# Patient Record
Sex: Female | Born: 1970 | Race: Black or African American | Hispanic: No | Smoking: Never smoker
Health system: Southern US, Community
[De-identification: ages and names within clinical notes are randomized; demographics above are authoritative.]

## PROBLEM LIST (undated history)

## (undated) DIAGNOSIS — M329 Systemic lupus erythematosus, unspecified: Secondary | ICD-10-CM

## (undated) DIAGNOSIS — D649 Anemia, unspecified: Secondary | ICD-10-CM

## (undated) DIAGNOSIS — K219 Gastro-esophageal reflux disease without esophagitis: Secondary | ICD-10-CM

## (undated) DIAGNOSIS — I10 Essential (primary) hypertension: Secondary | ICD-10-CM

## (undated) DIAGNOSIS — G43909 Migraine, unspecified, not intractable, without status migrainosus: Secondary | ICD-10-CM

## (undated) DIAGNOSIS — IMO0002 Reserved for concepts with insufficient information to code with codable children: Secondary | ICD-10-CM

## (undated) DIAGNOSIS — Z90710 Acquired absence of both cervix and uterus: Secondary | ICD-10-CM

## (undated) HISTORY — PX: ABDOMINAL HYSTERECTOMY: SHX81

## (undated) HISTORY — DX: Systemic lupus erythematosus, unspecified: M32.9

## (undated) HISTORY — PX: TUBAL LIGATION: SHX77

## (undated) HISTORY — DX: Reserved for concepts with insufficient information to code with codable children: IMO0002

## (undated) HISTORY — PX: WISDOM TOOTH EXTRACTION: SHX21

---

## 2001-05-07 ENCOUNTER — Encounter: Admission: RE | Admit: 2001-05-07 | Discharge: 2001-05-07 | Payer: Self-pay | Admitting: Family Medicine

## 2002-03-25 ENCOUNTER — Other Ambulatory Visit: Admission: RE | Admit: 2002-03-25 | Discharge: 2002-03-25 | Payer: Self-pay | Admitting: *Deleted

## 2002-09-20 ENCOUNTER — Other Ambulatory Visit: Admission: RE | Admit: 2002-09-20 | Discharge: 2002-09-20 | Payer: Self-pay | Admitting: *Deleted

## 2002-09-30 ENCOUNTER — Ambulatory Visit (HOSPITAL_COMMUNITY): Admission: RE | Admit: 2002-09-30 | Discharge: 2002-09-30 | Payer: Self-pay | Admitting: *Deleted

## 2002-12-08 ENCOUNTER — Encounter: Payer: Self-pay | Admitting: *Deleted

## 2002-12-08 ENCOUNTER — Ambulatory Visit (HOSPITAL_COMMUNITY): Admission: RE | Admit: 2002-12-08 | Discharge: 2002-12-08 | Payer: Self-pay | Admitting: *Deleted

## 2004-07-15 ENCOUNTER — Encounter: Admission: RE | Admit: 2004-07-15 | Discharge: 2004-07-15 | Payer: Self-pay | Admitting: *Deleted

## 2008-07-28 ENCOUNTER — Ambulatory Visit (HOSPITAL_COMMUNITY): Admission: RE | Admit: 2008-07-28 | Discharge: 2008-07-28 | Payer: Self-pay | Admitting: Unknown Physician Specialty

## 2010-04-02 ENCOUNTER — Encounter: Admission: RE | Admit: 2010-04-02 | Discharge: 2010-04-02 | Payer: Self-pay | Admitting: Chiropractic Medicine

## 2010-08-06 ENCOUNTER — Ambulatory Visit (HOSPITAL_COMMUNITY)
Admission: RE | Admit: 2010-08-06 | Discharge: 2010-08-06 | Payer: Self-pay | Source: Home / Self Care | Attending: Obstetrics and Gynecology | Admitting: Obstetrics and Gynecology

## 2010-12-27 NOTE — H&P (Signed)
   NAMEASTRIA, JORDAHL NO.:  [id number]   MEDICAL RECORD NO.:  [id number]                   PATIENT TYPE:  AMB   LOCATION:  [facility]                                  [facility]ACILITY:  [facility]   PHYSICIAN:  Heron B. Earlene Plater, M.D.               DATE OF BIRTH:  Dec 12, 1970   DATE OF ADMISSION:  09/30/2002  DATE OF DISCHARGE:                                HISTORY & PHYSICAL   PREOPERATIVE DIAGNOSIS:  Desires tubal sterilization.   INTENDED PROCEDURE:  Essure tubal sterilization.   HISTORY OF PRESENT ILLNESS:  The patient is a 40 year old African-American  female, gravida 2, para 1, A1, who presents for permanent sterilization.  Optionis have been discussed, and the patient would like try the Essure  procedure.   PAST MEDICAL HISTORY:  1. Lupus with associated hypertension.  2. Arthritis.   PAST SURGICAL HISTORY:  C-section x 1 with preeclampsia.   MEDICATIONS:  1. Plaquenil 60 mg daily.  2. Vioxx p.r.n.  3. Micronor.   ALLERGIES:  CODEINE.   SOCIAL HISTORY:  No tobacco, occasional alcohol, no other drugs.   FAMILY HISTORY:  Heart disease, diabetes, hypertension, and sarcoid.   REVIEW OF SYSTEMS:  Otherwise noncontributory.   PHYSICAL EXAMINATION:  VITAL SIGNS:  Blood pressure 140/98, temperature  98.6, weight 131-3/4.  GENERAL:  No acute distress.  SKIN: Malar rash noted consistent with lupus.  NECK:  Supple.  No thyromegaly.  HEART:  Regular rate and rhythm.  LUNGS:  Clear to auscultation.  ABDOMEN:  No hernia.  LYMPH NOTE SURGERY:  Negative neck, axillae, and groin.  BREASTS:  No dominant mass, discharge, or adenopathy.  PELVIC:  External genitalia, vagina, cervix normal.  Uterus normal size,  nontender. Adnexa without mass or tenderness.   ASSESSMENT:  Desires permanent sterilization.  Non-permanent alternatives  discussed, and other permanent methods such as laparoscopic tubal were  discussed.  The patient informed of 10% inability to cannulate  or visualize  both tubal ostia, also informed regarding three-month backup method  necessary and the three-month postop HSG as mandated by the FDA.  Operative  risks discussed including infection, bleeding, uterine perforation, damage  to bowel or bladder, orifice.  All questions answered.  The patient wishes  to proceed.                                               Gerri Spore B. Earlene Plater, M.D.    WBD/MEDQ  D:  09/23/2002  T:  09/23/2002  Job:  [id number]

## 2010-12-27 NOTE — Op Note (Signed)
   NAME:  ZOII, FLORER                         ACCOUNT NO.:  [id number]   MEDICAL RECORD NO.:  [id number]                   PATIENT TYPE:  AMB   LOCATION:  [facility]C                                  [facility]ILITY:  [facility]   PHYSICIAN:  [place] B. Earlene Plater, M.D.               DATE OF BIRTH:  Jul 16, 1971   DATE OF PROCEDURE:  09/30/2002  DATE OF DISCHARGE:                                 OPERATIVE REPORT   PREOPERATIVE DIAGNOSES:  Desires tubal sterilization.   POSTOPERATIVE DIAGNOSES:  Desires tubal sterilization.   PROCEDURE:  Essure tubal sterilization.   SURGEON:  Chester Holstein. Earlene Plater, M.D.   ANESTHESIA:  General (with LMA.   FINDINGS:  Atrophic endometrium.  Normal tubal ostia bilaterally.  Coils  noted after procedure, five on the right, two on the left.   FLUID DEFICIT:  25 mL of saline.   INDICATIONS:  Desires permanent sterilization with the Essure procedure.  Has been advised of need for three months of continued back-up contraception  and a three month postoperative hysterosalpingogram to confirm tubal  occlusion.   PROCEDURE:  The patient was taken to the operating room and LMA general  anesthesia obtained.  She was placed in the ski position and prepped and  draped in a standard fashion.  Bladder was emptied with a red rubber  catheter.  Examination under anesthesia showed a normal sized anteverted  uterus.  No adnexal masses.   Speculum was inserted.  Single tooth tenaculum placed on the anterior lip of  the cervix.  A 5 mm 30 degree hysteroscope inserted after being flushed with  saline.  With good uterine distention, both tubal ostia were visualized.  The right tube was first approached and the Essure device inserted to the  depth marker and released in a standard fashion.  Five coils were noted on  the right side after placement.  Procedure repeated on the opposite side in  the same fashion and two coils noted on the left.   The single tooth was removed and cervix hemostatic.   The patient tolerated  procedure well.  There were no complications.  She was taken to recovery  room awake, alert, in stable condition.                                               Gerri Spore B. Earlene Plater, M.D.    WBD/MEDQ  D:  09/30/2002  T:  09/30/2002  Job:  [id number]

## 2011-01-02 ENCOUNTER — Emergency Department (HOSPITAL_COMMUNITY)
Admission: EM | Admit: 2011-01-02 | Discharge: 2011-01-02 | Disposition: A | Discharge status: Home / Self Care | Payer: 59 | Attending: Emergency Medicine | Admitting: Emergency Medicine

## 2011-01-02 ENCOUNTER — Emergency Department (HOSPITAL_COMMUNITY): Payer: 59

## 2011-01-02 DIAGNOSIS — Z79899 Other long term (current) drug therapy: Secondary | ICD-10-CM | POA: Insufficient documentation

## 2011-01-02 DIAGNOSIS — I10 Essential (primary) hypertension: Secondary | ICD-10-CM | POA: Insufficient documentation

## 2011-01-02 DIAGNOSIS — M329 Systemic lupus erythematosus, unspecified: Secondary | ICD-10-CM | POA: Insufficient documentation

## 2011-01-02 DIAGNOSIS — K219 Gastro-esophageal reflux disease without esophagitis: Secondary | ICD-10-CM | POA: Insufficient documentation

## 2011-01-02 DIAGNOSIS — Z7982 Long term (current) use of aspirin: Secondary | ICD-10-CM | POA: Insufficient documentation

## 2011-01-02 DIAGNOSIS — R079 Chest pain, unspecified: Secondary | ICD-10-CM | POA: Insufficient documentation

## 2011-01-02 LAB — BASIC METABOLIC PANEL
BUN: 8 mg/dL (ref 6–23)
CO2: 27 mEq/L (ref 19–32)
Calcium: 9.4 mg/dL (ref 8.4–10.5)
Chloride: 103 mEq/L (ref 96–112)
Creatinine, Ser: 0.69 mg/dL (ref 0.4–1.2)
GFR calc Af Amer: >60 mL/min (ref >60)
GFR calc non Af Amer: >60 mL/min (ref >60)
Potassium: 3.9 mEq/L (ref 3.5–5.1)
Sodium: 138 mEq/L (ref 135–145)

## 2011-01-02 LAB — CK TOTAL AND CKMB (NOT AT ARMC)
CK, MB: 1.6 ng/mL (ref 0.3–4.0)
Relative Index: 1.1 (ref 0.0–2.5)
Total CK: 143 U/L (ref 7–177)

## 2011-01-02 LAB — URINE MICROSCOPIC-ADD ON

## 2011-01-02 LAB — URINALYSIS, ROUTINE W REFLEX MICROSCOPIC
Bilirubin Urine: NEGATIVE
Glucose, UA: NEGATIVE mg/dL
Hgb urine dipstick: NEGATIVE
Ketones, ur: NEGATIVE mg/dL
Nitrite: NEGATIVE
Protein, ur: NEGATIVE mg/dL
Specific Gravity, Urine: 1.007 (ref 1.005–1.030)
Urobilinogen, UA: 0.2 mg/dL (ref 0.0–1.0)
pH: 6.5 (ref 5.0–8.0)

## 2011-01-02 LAB — DIFFERENTIAL
Basophils Absolute: 0 10*3/uL (ref 0.0–0.1)
Basophils Relative: 1 % (ref 0–1)
Eosinophils Absolute: 0.1 10*3/uL (ref 0.0–0.7)
Eosinophils Relative: 1 % (ref 0–5)
Lymphocytes Relative: 23 % (ref 12–46)
Lymphs Abs: 1.6 10*3/uL (ref 0.7–4.0)
Monocytes Absolute: 0.5 10*3/uL (ref 0.1–1.0)
Monocytes Relative: 7 % (ref 3–12)
Neutro Abs: 4.8 10*3/uL (ref 1.7–7.7)
Neutrophils Relative %: 68 % (ref 43–77)

## 2011-01-02 LAB — POCT PREGNANCY, URINE: Preg Test, Ur: NEGATIVE

## 2011-01-02 LAB — CBC
HCT: 36.3 % (ref 36.0–46.0)
Hemoglobin: 11.7 g/dL — ABNORMAL LOW (ref 12.0–15.0)
MCH: 26.4 pg (ref 26.0–34.0)
MCHC: 32.2 g/dL (ref 30.0–36.0)
MCV: 81.8 fL (ref 78.0–100.0)
Platelets: 229 10*3/uL (ref 150–400)
RBC: 4.44 MIL/uL (ref 3.87–5.11)
RDW: 16.7 % — ABNORMAL HIGH (ref 11.5–15.5)
WBC: 7 10*3/uL (ref 4.0–10.5)

## 2011-01-02 LAB — PROTIME-INR
INR: 0.93 (ref 0.00–1.49)
Prothrombin Time: 12.7 seconds (ref 11.6–15.2)

## 2011-01-02 LAB — TROPONIN I: Troponin I: <0.3 ng/mL (ref <0.30)

## 2011-01-02 LAB — BASIC METABOLIC PANEL WITH GFR: Glucose, Bld: 86 mg/dL (ref 70–99)

## 2011-06-18 ENCOUNTER — Other Ambulatory Visit: Payer: Self-pay | Admitting: Internal Medicine

## 2011-06-18 DIAGNOSIS — Z139 Encounter for screening, unspecified: Secondary | ICD-10-CM

## 2011-06-25 ENCOUNTER — Ambulatory Visit
Admission: RE | Admit: 2011-06-25 | Discharge: 2011-06-25 | Disposition: A | Discharge status: Home / Self Care | Payer: 59 | Source: Ambulatory Visit | Attending: Internal Medicine | Admitting: Internal Medicine

## 2011-06-25 DIAGNOSIS — Z139 Encounter for screening, unspecified: Secondary | ICD-10-CM

## 2011-07-09 ENCOUNTER — Encounter (HOSPITAL_COMMUNITY): Payer: Self-pay | Admitting: *Deleted

## 2011-07-28 ENCOUNTER — Encounter (HOSPITAL_COMMUNITY): Payer: Self-pay

## 2011-07-31 ENCOUNTER — Other Ambulatory Visit: Payer: Self-pay | Admitting: Obstetrics and Gynecology

## 2011-08-12 HISTORY — PX: ENDOMETRIAL ABLATION: SHX621

## 2011-08-29 ENCOUNTER — Other Ambulatory Visit: Payer: Self-pay | Admitting: Obstetrics and Gynecology

## 2011-09-07 NOTE — H&P (Signed)
NAMESERAYA, JOBST NO.:  [id number]  MEDICAL RECORD NO.:  [id number]  LOCATION:  [facility]                         [facility]ILITY:  [facility]  PHYSICIAN:  Lenoard Aden, M.D.DATE OF BIRTH:  01-11-1971  DATE OF ADMISSION:  06/27/2011 DATE OF DISCHARGE:                             HISTORY & PHYSICAL   CHIEF COMPLAINT:  Refractory menorrhagia.  HISTORY OF PRESENT ILLNESS:  She is a 41 year old, African American female, G2, P1, who presents for refractory menorrhagia and primary intervention to include NovaSure endometrial ablation.  She has allergies to CODEINE and SULFA.  Medications include iron.  She has a medical history remarkable for lupus, hypertension, arthritis, and iron-deficiency anemia.  She has family history of heart disease, hypertension, breast cancer, and diabetes.  She is a nonsmoker, nondrinker.  Denying domestic or physical violence.  OBSTETRIC HISTORY:  Remarkable for 1 spontaneous abortion, 1 C-section with history of Essure tubal ligation in 2004, history of abnormal Pap smear.  PHYSICAL EXAMINATION:  GENERAL:  She is a well-developed, well- nourished, African American female.  Weight of 169 pounds.  Height of 65 inches. HEENT:  Normal. NECK:  Supple.  Full range of motion. LUNGS:  Clear. HEART:  Regular rhythm. ABDOMEN:  Soft, nontender. PELVIC:  A bulky, anteflexed uterus and no adnexal masses. EXTREMITIES:  No cords. NEUROLOGIC:  Nonfocal. SKIN:  Intact.  IMPRESSION:  Symptomatic menorrhagia with a history of Essure tubal ligation, history of uterine fibroids with no submucosal component.  PLAN:  Proceed with diagnostic hysteroscopy, D and C, NovaSure endometrial ablation.  Risks of anesthesia, infection, bleeding, injury to abdominal organs, need for repair were discussed.  Delayed versus immediate complications to include bowel and bladder injury were noted. The patient acknowledges and wishes to proceed.     Lenoard Aden, M.D.     RJT/MEDQ  D:  09/07/2011  T:  09/07/2011  Job:  [id number]

## 2011-09-08 ENCOUNTER — Ambulatory Visit (HOSPITAL_COMMUNITY)
Admission: RE | Admit: 2011-09-08 | Discharge: 2011-09-08 | Disposition: A | Discharge status: Home / Self Care | Payer: 59 | Source: Ambulatory Visit | Attending: Obstetrics and Gynecology | Admitting: Obstetrics and Gynecology

## 2011-09-08 ENCOUNTER — Encounter (HOSPITAL_COMMUNITY): Payer: Self-pay | Admitting: Anesthesiology

## 2011-09-08 ENCOUNTER — Ambulatory Visit (HOSPITAL_COMMUNITY): Payer: 59 | Admitting: Anesthesiology

## 2011-09-08 ENCOUNTER — Other Ambulatory Visit: Payer: Self-pay | Admitting: Obstetrics and Gynecology

## 2011-09-08 ENCOUNTER — Encounter (HOSPITAL_COMMUNITY)
Admission: RE | Disposition: A | Discharge status: Home / Self Care | Payer: Self-pay | Source: Ambulatory Visit | Attending: Obstetrics and Gynecology

## 2011-09-08 ENCOUNTER — Encounter (HOSPITAL_COMMUNITY): Payer: Self-pay | Admitting: *Deleted

## 2011-09-08 DIAGNOSIS — N840 Polyp of corpus uteri: Secondary | ICD-10-CM | POA: Insufficient documentation

## 2011-09-08 DIAGNOSIS — D259 Leiomyoma of uterus, unspecified: Secondary | ICD-10-CM | POA: Insufficient documentation

## 2011-09-08 DIAGNOSIS — I10 Essential (primary) hypertension: Secondary | ICD-10-CM | POA: Insufficient documentation

## 2011-09-08 DIAGNOSIS — M329 Systemic lupus erythematosus, unspecified: Secondary | ICD-10-CM | POA: Insufficient documentation

## 2011-09-08 DIAGNOSIS — N920 Excessive and frequent menstruation with regular cycle: Secondary | ICD-10-CM

## 2011-09-08 HISTORY — DX: Gastro-esophageal reflux disease without esophagitis: K21.9

## 2011-09-08 HISTORY — DX: Anemia, unspecified: D64.9

## 2011-09-08 LAB — CBC
Platelets: 189 10*3/uL (ref 150–400)
RDW: 14 % (ref 11.5–15.5)
WBC: 8.4 10*3/uL (ref 4.0–10.5)

## 2011-09-08 SURGERY — DILATATION & CURETTAGE/HYSTEROSCOPY WITH NOVASURE ABLATION
Anesthesia: General

## 2011-09-08 MED ORDER — PROMETHAZINE HCL 25 MG/ML IJ SOLN: 6.2500 mg | INTRAMUSCULAR | Status: DC | PRN

## 2011-09-08 MED ORDER — LACTATED RINGERS IV SOLN
INTRAVENOUS | Status: DC
Start: 2011-09-08 — End: 2011-09-08
  Administered 2011-09-08: 12:00:00 via INTRAVENOUS

## 2011-09-08 MED ORDER — LIDOCAINE HCL (CARDIAC) 20 MG/ML IV SOLN
INTRAVENOUS | Status: AC
  Filled 2011-09-08: qty 5

## 2011-09-08 MED ORDER — BUPIVACAINE HCL (PF) 0.25 % IJ SOLN
INTRAMUSCULAR | Status: DC | PRN
  Administered 2011-09-08: 20 mL

## 2011-09-08 MED ORDER — ESMOLOL HCL 10 MG/ML IV SOLN
INTRAVENOUS | Status: DC | PRN
  Administered 2011-09-08 (×2): 25 mg via INTRAVENOUS

## 2011-09-08 MED ORDER — FENTANYL CITRATE 0.05 MG/ML IJ SOLN
INTRAMUSCULAR | Status: AC
  Filled 2011-09-08: qty 2

## 2011-09-08 MED ORDER — KETOROLAC TROMETHAMINE 30 MG/ML IJ SOLN
INTRAMUSCULAR | Status: AC
  Filled 2011-09-08: qty 1

## 2011-09-08 MED ORDER — KETOROLAC TROMETHAMINE 30 MG/ML IJ SOLN: 15.0000 mg | Freq: Once | INTRAMUSCULAR | Status: DC | PRN

## 2011-09-08 MED ORDER — ONDANSETRON HCL 4 MG/2ML IJ SOLN
INTRAMUSCULAR | Status: AC
  Filled 2011-09-08: qty 2

## 2011-09-08 MED ORDER — ACETAMINOPHEN 325 MG PO TABS: 325.0000 mg | ORAL_TABLET | ORAL | Status: DC | PRN

## 2011-09-08 MED ORDER — LACTATED RINGERS IR SOLN
Status: DC | PRN
  Administered 2011-09-08: 1

## 2011-09-08 MED ORDER — PROPOFOL 10 MG/ML IV EMUL
INTRAVENOUS | Status: DC | PRN
  Administered 2011-09-08: 200 mg via INTRAVENOUS

## 2011-09-08 MED ORDER — KETOROLAC TROMETHAMINE 30 MG/ML IJ SOLN
INTRAMUSCULAR | Status: DC | PRN
  Administered 2011-09-08: 30 mg via INTRAMUSCULAR
  Administered 2011-09-08: 30 mg via INTRAVENOUS

## 2011-09-08 MED ORDER — DEXAMETHASONE SODIUM PHOSPHATE 10 MG/ML IJ SOLN
INTRAMUSCULAR | Status: AC
  Filled 2011-09-08: qty 1

## 2011-09-08 MED ORDER — LIDOCAINE HCL (CARDIAC) 20 MG/ML IV SOLN
INTRAVENOUS | Status: DC | PRN
  Administered 2011-09-08: 100 mg via INTRAVENOUS

## 2011-09-08 MED ORDER — MIDAZOLAM HCL 2 MG/2ML IJ SOLN
INTRAMUSCULAR | Status: AC
  Filled 2011-09-08: qty 2

## 2011-09-08 MED ORDER — PANTOPRAZOLE SODIUM 40 MG PO TBEC
DELAYED_RELEASE_TABLET | ORAL | Status: AC
  Filled 2011-09-08: qty 1

## 2011-09-08 MED ORDER — BUPIVACAINE HCL (PF) 0.25 % IJ SOLN
INTRAMUSCULAR | Status: AC
  Filled 2011-09-08: qty 30

## 2011-09-08 MED ORDER — OXYCODONE-ACETAMINOPHEN 5-325 MG PO TABS: 1.0000 | ORAL_TABLET | Freq: Four times a day (QID) | ORAL | Status: AC | PRN

## 2011-09-08 MED ORDER — FENTANYL CITRATE 0.05 MG/ML IJ SOLN: 25.0000 ug | INTRAMUSCULAR | Status: DC | PRN

## 2011-09-08 MED ORDER — PANTOPRAZOLE SODIUM 40 MG PO TBEC
40.0000 mg | DELAYED_RELEASE_TABLET | Freq: Once | ORAL | Status: AC
  Administered 2011-09-08: 40 mg via ORAL

## 2011-09-08 MED ORDER — PROPOFOL 10 MG/ML IV EMUL
INTRAVENOUS | Status: AC
  Filled 2011-09-08: qty 20

## 2011-09-08 MED ORDER — FENTANYL CITRATE 0.05 MG/ML IJ SOLN
INTRAMUSCULAR | Status: DC | PRN
  Administered 2011-09-08: 50 ug via INTRAVENOUS
  Administered 2011-09-08 (×4): 25 ug via INTRAVENOUS
  Administered 2011-09-08: 50 ug via INTRAVENOUS

## 2011-09-08 MED ORDER — DEXAMETHASONE SODIUM PHOSPHATE 4 MG/ML IJ SOLN
INTRAMUSCULAR | Status: DC | PRN
  Administered 2011-09-08: 10 mg via INTRAVENOUS

## 2011-09-08 MED ORDER — ONDANSETRON HCL 4 MG/2ML IJ SOLN
INTRAMUSCULAR | Status: DC | PRN
  Administered 2011-09-08: 4 mg via INTRAVENOUS

## 2011-09-08 MED ORDER — VASOPRESSIN 20 UNIT/ML IJ SOLN
INTRAMUSCULAR | Status: AC
  Filled 2011-09-08: qty 1

## 2011-09-08 MED ORDER — CEFAZOLIN SODIUM 1-5 GM-% IV SOLN
1.0000 g | INTRAVENOUS | Status: AC
Start: 2011-09-08 — End: 2011-09-08
  Administered 2011-09-08: 1 g via INTRAVENOUS

## 2011-09-08 MED ORDER — MIDAZOLAM HCL 5 MG/5ML IJ SOLN
INTRAMUSCULAR | Status: DC | PRN
  Administered 2011-09-08 (×2): 1 mg via INTRAVENOUS

## 2011-09-08 MED ORDER — ESMOLOL HCL 10 MG/ML IV SOLN
INTRAVENOUS | Status: AC
  Filled 2011-09-08: qty 10

## 2011-09-08 SURGICAL SUPPLY — 12 items
ABLATOR ENDOMETRIAL BIPOLAR (ABLATOR) ×2 IMPLANT
CATH ROBINSON RED A/P 16FR (CATHETERS) ×2 IMPLANT
CLOTH BEACON ORANGE TIMEOUT ST (SAFETY) ×2 IMPLANT
CONTAINER PREFILL 10% NBF 60ML (FORM) ×4 IMPLANT
GLOVE BIO SURGEON STRL SZ7.5 (GLOVE) ×4 IMPLANT
GOWN PREVENTION PLUS LG XLONG (DISPOSABLE) ×2 IMPLANT
GOWN STRL REIN XL XLG (GOWN DISPOSABLE) ×2 IMPLANT
PACK HYSTEROSCOPY LF (CUSTOM PROCEDURE TRAY) ×2 IMPLANT
PAD PREP 24X48 CUFFED NSTRL (MISCELLANEOUS) ×2 IMPLANT
SYR TB 1ML 25GX5/8 (SYRINGE) ×2 IMPLANT
TOWEL OR 17X24 6PK STRL BLUE (TOWEL DISPOSABLE) ×4 IMPLANT
WATER STERILE IRR 1000ML POUR (IV SOLUTION) ×2 IMPLANT

## 2011-09-08 NOTE — Transfer of Care (Signed)
Immediate Anesthesia Transfer of Care Note  Patient: Alicia Patel  Procedure(s) Performed:  DILATATION & CURETTAGE/HYSTEROSCOPY WITH NOVASURE ABLATION  Patient Location: PACU  Anesthesia Type: General  Level of Consciousness: awake, alert  and oriented  Airway & Oxygen Therapy: Patient Spontanous Breathing and Patient connected to nasal cannula oxygen  Post-op Assessment: Report given to PACU RN, Post -op Vital signs reviewed and stable and Patient moving all extremities X 4  Post vital signs: Reviewed and stable  Complications: No apparent anesthesia complications

## 2011-09-08 NOTE — Progress Notes (Signed)
Patient ID: Alicia Patel, female   DOB: 1970-10-14, 41 y.o.   MRN: [id number] Patient seen and examined. Consent witnessed and signed. No changes noted. Update completed.

## 2011-09-08 NOTE — Anesthesia Postprocedure Evaluation (Signed)
Anesthesia Post Note  Patient: Alicia Patel  Procedure(s) Performed:  DILATATION & CURETTAGE/HYSTEROSCOPY WITH NOVASURE ABLATION  Anesthesia type: GA  Patient location: PACU  Post pain: Pain level controlled  Post assessment: Post-op Vital signs reviewed  Last Vitals:  Filed Vitals:   09/08/11 1445  BP: 161/88  Pulse: 65  Temp:   Resp: 16    Post vital signs: Reviewed  Level of consciousness: sedated  Complications: No apparent anesthesia complications

## 2011-09-08 NOTE — Op Note (Signed)
Alicia Patel, Alicia Patel NO.:  [id number]  MEDICAL RECORD NO.:  [id number]  LOCATION:  [facility]                          [facility]ILITY:  [facility]  PHYSICIAN:  Lenoard Aden, M.D.DATE OF BIRTH:  04/18/1971  DATE OF PROCEDURE:  09/08/2011 DATE OF DISCHARGE:                              OPERATIVE REPORT   SURGEON:  Lenoard Aden, M.D.  DESCRIPTION OF PROCEDURE:  After being apprised of risks of anesthesia, infection, bleeding, injury to abdominal organs, need for repair, delayed versus immediate complications include bowel, bladder injury, possible need for repair, inability to remedy all vaginal bleeding, the patient was brought to the operating room and was administered general anesthetic without complications.  Prepped and draped in the usual sterile fashion.  Catheterized the bladder until it was empty.  Exam under anesthesia revealed an irregularly shaped, mid positioned uterus consistent with multiple uterine fibroids.  At this time, exam was turned to the vaginal portion procedure whereby dilute paracervical block placed with 20 mL of dilute Marcaine solution in total.  Cervix was then easily dilated up to a #23 Pratt dilator.  Hysteroscope was placed.  Visualization reveals a large but otherwise normal-appearing endometrial cavity with an anterior wall followup polyp which was removed using polyp forceps.  After achieving this, D and C was performed in a 4-quadrant method using sharp curettage, and tissue sent to pathology.  Good hemostasis was noted.  NovaSure device was entered, set to a length of 6.5 and a width of 4.5.  CO2 test performed was negative.  Procedure was initiated for 55 seconds without complication. Instruments were removed.  Revisualization reveals a predominantly well- ablated cavity with approximately 85-90% of the cavity ablated except for portion along the left anterior wall of the fundal area, otherwise good ablation was noticed and good  hemostasis was noted.  All instruments were removed.  Fluid deficit of 90 mL.  The patient tolerated the procedure well, was awakened, and transferred to recovery in good condition.     Lenoard Aden, M.D.     RJT/MEDQ  D:  09/08/2011  T:  09/08/2011  Job:  [id number]

## 2011-09-08 NOTE — Op Note (Signed)
09/08/2011  1:57 PM  PATIENT:  Alicia Patel  41 y.o. female  PRE-OPERATIVE DIAGNOSIS:  Menorrhagia Uterine fibroids  POST-OPERATIVE DIAGNOSIS:  Menorrhagia plus same Endometrial polyp  PROCEDURE:  Procedure(s): DILATATION & CURETTAGE/HYSTEROSCOPY WITH NOVASURE ABLATION Removal of endometrial polyp  SURGEON:  Surgeon(s): Lenoard Aden, MD  ASSISTANTS: none   ANESTHESIA:   local and general  ESTIMATED BLOOD LOSS: less than 50cc Fluid deficit 90cc  DRAINS: none   LOCAL MEDICATIONS USED:  MARCAINE 20CC  SPECIMEN:  Source of Specimen:  EMC and polyp  DISPOSITION OF SPECIMEN:  PATHOLOGY  COUNTS:  YES  DICTATION #: [id number]  PLAN OF CARE: DC home  PATIENT DISPOSITION:  PACU - hemodynamically stable.

## 2011-09-08 NOTE — Anesthesia Preprocedure Evaluation (Addendum)
Anesthesia Evaluation  Patient identified by MRN, date of birth, ID band Patient awake    Reviewed: Allergy & Precautions, H&P , Patient's Chart, lab work & pertinent test results, reviewed documented beta blocker date and time   History of Anesthesia Complications Negative for: history of anesthetic complications  Airway Mallampati: II TM Distance: >3 FB Neck ROM: full    Dental No notable dental hx.    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   Dental Advisory Given  Plan Discussed with: CRNA, Surgeon and Anesthesiologist  Anesthesia Plan Comments:         Anesthesia Quick Evaluation

## 2011-10-01 ENCOUNTER — Emergency Department (HOSPITAL_COMMUNITY): Payer: 59

## 2011-10-01 ENCOUNTER — Encounter (HOSPITAL_COMMUNITY): Payer: Self-pay | Admitting: Emergency Medicine

## 2011-10-01 ENCOUNTER — Emergency Department (HOSPITAL_COMMUNITY)
Admission: EM | Admit: 2011-10-01 | Discharge: 2011-10-01 | Disposition: A | Discharge status: Home / Self Care | Payer: 59 | Attending: Emergency Medicine | Admitting: Emergency Medicine

## 2011-10-01 DIAGNOSIS — D259 Leiomyoma of uterus, unspecified: Secondary | ICD-10-CM | POA: Insufficient documentation

## 2011-10-01 DIAGNOSIS — D219 Benign neoplasm of connective and other soft tissue, unspecified: Secondary | ICD-10-CM

## 2011-10-01 DIAGNOSIS — R109 Unspecified abdominal pain: Secondary | ICD-10-CM | POA: Insufficient documentation

## 2011-10-01 DIAGNOSIS — R10819 Abdominal tenderness, unspecified site: Secondary | ICD-10-CM | POA: Insufficient documentation

## 2011-10-01 DIAGNOSIS — N83209 Unspecified ovarian cyst, unspecified side: Secondary | ICD-10-CM

## 2011-10-01 LAB — URINALYSIS, ROUTINE W REFLEX MICROSCOPIC
Bilirubin Urine: NEGATIVE
Glucose, UA: NEGATIVE mg/dL
Hgb urine dipstick: NEGATIVE
Leukocytes, UA: NEGATIVE
Nitrite: NEGATIVE
Protein, ur: NEGATIVE mg/dL
Specific Gravity, Urine: 1.019 (ref 1.005–1.030)
Urobilinogen, UA: 1 mg/dL (ref 0.0–1.0)
pH: 6 (ref 5.0–8.0)

## 2011-10-01 LAB — DIFFERENTIAL
Basophils Absolute: 0 10*3/uL (ref 0.0–0.1)
Basophils Relative: 0 % (ref 0–1)
Eosinophils Absolute: 0 10*3/uL (ref 0.0–0.7)
Eosinophils Relative: 0 % (ref 0–5)
Lymphocytes Relative: 12 % (ref 12–46)
Lymphs Abs: 1.1 10*3/uL (ref 0.7–4.0)
Monocytes Absolute: 0.6 10*3/uL (ref 0.1–1.0)
Monocytes Relative: 7 % (ref 3–12)
Neutro Abs: 6.8 10*3/uL (ref 1.7–7.7)
Neutrophils Relative %: 80 % — ABNORMAL HIGH (ref 43–77)

## 2011-10-01 LAB — CBC
HCT: 34.5 % — ABNORMAL LOW (ref 36.0–46.0)
Hemoglobin: 11.1 g/dL — ABNORMAL LOW (ref 12.0–15.0)
MCH: 26 pg (ref 26.0–34.0)
MCHC: 32.2 g/dL (ref 30.0–36.0)
MCV: 80.8 fL (ref 78.0–100.0)
Platelets: 233 10*3/uL (ref 150–400)
RBC: 4.27 MIL/uL (ref 3.87–5.11)
RDW: 14 % (ref 11.5–15.5)
WBC: 8.5 10*3/uL (ref 4.0–10.5)

## 2011-10-01 LAB — BASIC METABOLIC PANEL
BUN: 11 mg/dL (ref 6–23)
CO2: 24 mEq/L (ref 19–32)
Calcium: 9.5 mg/dL (ref 8.4–10.5)
Chloride: 102 mEq/L (ref 96–112)
Creatinine, Ser: 0.72 mg/dL (ref 0.50–1.10)
GFR calc Af Amer: >90 mL/min (ref >90)
GFR calc non Af Amer: >90 mL/min (ref >90)
Glucose, Bld: 95 mg/dL (ref 70–99)
Potassium: 3.7 mEq/L (ref 3.5–5.1)
Sodium: 136 mEq/L (ref 135–145)

## 2011-10-01 MED ORDER — PROMETHAZINE HCL 25 MG PO TABS: 25.0000 mg | ORAL_TABLET | Freq: Four times a day (QID) | ORAL | Status: AC | PRN

## 2011-10-01 MED ORDER — OXYCODONE-ACETAMINOPHEN 5-325 MG PO TABS: 2.0000 | ORAL_TABLET | ORAL | Status: AC | PRN

## 2011-10-01 MED ORDER — ONDANSETRON HCL 4 MG/2ML IJ SOLN
4.0000 mg | Freq: Once | INTRAMUSCULAR | Status: AC
  Administered 2011-10-01: 4 mg via INTRAVENOUS
  Filled 2011-10-01: qty 2

## 2011-10-01 MED ORDER — IOHEXOL 300 MG/ML  SOLN
100.0000 mL | Freq: Once | INTRAMUSCULAR | Status: AC | PRN
Start: 2011-10-01 — End: 2011-10-01
  Administered 2011-10-01: 100 mL via INTRAVENOUS

## 2011-10-01 MED ORDER — SODIUM CHLORIDE 0.9 % IV BOLUS (SEPSIS)
1000.0000 mL | Freq: Once | INTRAVENOUS | Status: AC
  Administered 2011-10-01: 1000 mL via INTRAVENOUS

## 2011-10-01 MED ORDER — MORPHINE SULFATE 4 MG/ML IJ SOLN
4.0000 mg | Freq: Once | INTRAMUSCULAR | Status: AC
  Administered 2011-10-01: 4 mg via INTRAVENOUS
  Filled 2011-10-01: qty 1

## 2011-10-01 NOTE — ED Notes (Signed)
RLQ abd pain that started overnight while at work, is gradually worsening but improved with meds since arrival,no hx of abd pain.  States that she was having increased gas and burping this evening, was given medications at work and this did not improve her pain. She denies dysuria, diarrhea, fevers chills nausea or vomiting  PE:  RLQ ttp, no other abd ttp,k normal lungs and heart exam, no edema, no other findings.  Assessment - r/o appy, labs, ua, CT pending.  Otherwise well appaering.  Medical screening examination/treatment/procedure(s) were conducted as a shared visit with non-physician practitioner(s) and myself.  I personally evaluated the patient during the encounter   Vida Roller, MD 10/01/11 [phone]

## 2011-10-01 NOTE — ED Provider Notes (Signed)
History     CSN: [id number]  Arrival date & time 10/01/11  0045   First MD Initiated Contact with Patient 10/01/11 0120      Chief Complaint  Patient presents with  . Abdominal Pain     Patient is a 41 y.o. female presenting with abdominal pain. The history is provided by the patient.  Abdominal Pain The primary symptoms of the illness include abdominal pain. The primary symptoms of the illness do not include fever, nausea, vomiting, diarrhea, hematemesis, hematochezia, dysuria, vaginal discharge or vaginal bleeding. The current episode started 3 to 5 hours ago. The onset of the illness was gradual. The problem has been gradually worsening.  The patient states that she believes she is currently not pregnant. The patient has not had a change in bowel habit. Additional symptoms associated with the illness include anorexia. Symptoms associated with the illness do not include chills, heartburn, constipation, urgency or frequency. Significant associated medical issues include GERD.  Pt reports onset of periumbilical abd pain at approx 2130 tonight that she initially thought was "gas". She took and "anti-gas" med and got no relief. Pain gradually migrated to RLQ and worsened. Admits stomach " felt strange "all day and that she did not feel like eating a lot but denies N/V/D, fever or other related symptoms. Pt had an ablation performed approx 4 weeks ago for heavy vag bleeding and has done well since procedure.  Past Medical History  Diagnosis Date  . Anemia     history  . GERD (gastroesophageal reflux disease)     Past Surgical History  Procedure Date  . Tubal ligation     essure tubal  . Cesarean section   . Wisdom tooth extraction     No family history on file.  History  Substance Use Topics  . Smoking status: Never Smoker   . Smokeless tobacco: Never Used  . Alcohol Use: Yes     occasional wine    OB History    Grav Para Term Preterm Abortions TAB SAB Ect Mult Living               Review of Systems  Constitutional: Negative.  Negative for fever and chills.  HENT: Negative.   Eyes: Negative.   Respiratory: Negative.   Cardiovascular: Negative.   Gastrointestinal: Positive for abdominal pain and anorexia. Negative for heartburn, nausea, vomiting, diarrhea, constipation, hematochezia and hematemesis.  Genitourinary: Negative.  Negative for dysuria, urgency, frequency, vaginal bleeding and vaginal discharge.  Musculoskeletal: Negative.   Skin: Negative.   Neurological: Negative.   Hematological: Negative.   Psychiatric/Behavioral: Negative.     Allergies  Sulfa drugs cross reactors  Home Medications   Current Outpatient Rx  Name Route Sig Dispense Refill  . OMEPRAZOLE MAGNESIUM 20 MG PO TBEC Oral Take 20 mg by mouth daily.      Marland Kitchen PHENTERMINE HCL 37.5 MG PO CAPS Oral Take 37.5 mg by mouth every morning. Will stop taking two weeks prior to surgery.     . OXYCODONE-ACETAMINOPHEN 5-325 MG PO TABS Oral Take 2 tablets by mouth every 4 (four) hours as needed for pain. 6 tablet 0  . PROMETHAZINE HCL 25 MG PO TABS Oral Take 1 tablet (25 mg total) by mouth every 6 (six) hours as needed for nausea. 10 tablet 0    BP 137/90  Pulse 79  Temp(Src) 98.2 F (36.8 C) (Oral)  Resp 16  Wt 170 lb (77.111 kg)  SpO2 99%  LMP 09/10/2011  Physical Exam  Constitutional: She is oriented to person, place, and time. She appears well-developed and well-nourished.  HENT:  Head: Normocephalic and atraumatic.  Eyes: Conjunctivae are normal.  Neck: Neck supple.  Cardiovascular: Normal rate and regular rhythm.   Pulmonary/Chest: Effort normal and breath sounds normal.  Abdominal: Soft. Bowel sounds are normal. There is tenderness in the right lower quadrant and periumbilical area. There is no rebound.       Mild peri-umbilical TTP that radiates to the RLQ. RLQ TTP  Musculoskeletal: Normal range of motion.  Neurological: She is alert and oriented to person, place, and  time.  Skin: Skin is warm and dry. No erythema.  Psychiatric: She has a normal mood and affect.    ED Course  Procedures  Pt reports complete relief of pain w/ IV pain meds but states pain now beginning to return. Labs unremarkable. Will re-medicate for pain and await ct abd/pelvis w/ cm.  0430:  Pt currently pain free. Findings and clinical impression discussed w/ pt. Will plan for d/c home w/ meds for nausea and pain  and encourage pt to f/u w/ Dr Billy Coast as soon as can be arranged. Pt agreeable w/ plan. Discussed pt w/ Dr Hyacinth Meeker who is also agreeable w/ plan.   Labs Reviewed  CBC - Abnormal; Notable for the following:    Hemoglobin 11.1 (*)    HCT 34.5 (*)    All other components within normal limits  DIFFERENTIAL - Abnormal; Notable for the following:    Neutrophils Relative 80 (*)    All other components within normal limits  URINALYSIS, ROUTINE W REFLEX MICROSCOPIC - Abnormal; Notable for the following:    Ketones, ur TRACE (*)    All other components within normal limits  BASIC METABOLIC PANEL  POCT PREGNANCY, URINE   Ct Abdomen Pelvis W Contrast  10/01/2011  *RADIOLOGY REPORT*  Clinical Data: Right lower quadrant abdominal pain, radiating to the right upper quadrant.  Nausea.  CT ABDOMEN AND PELVIS WITH CONTRAST  Technique:  Multidetector CT imaging of the abdomen and pelvis was performed following the standard protocol during bolus administration of intravenous contrast.  Contrast: OMNIPAQUE IOHEXOL 300 MG/ML IV SOLN  Comparison: Lumbar spine and pelvis radiographs performed 04/02/2010  Findings: The visualized lung bases are clear.  There is minimal prominence of the intrahepatic biliary ducts; this likely remains within normal limits. The liver is unremarkable in appearance.  The gallbladder is within normal limits.  The common hepatic duct remains normal in caliber.  The spleen is within normal limits.  There is unusual vague hypoattenuation at the head of the pancreas,  measuring approximately 2.8 x 1.7 cm; though this could conceivably reflect minimal fatty infiltration, a poorly characterized mass cannot be excluded.  MRCP would be helpful for further evaluation, when and as deemed clinically appropriate.  The remainder of the pancreas is within normal limits.  The adrenal glands are unremarkable.  The kidneys are unremarkable in appearance.  There is no evidence of hydronephrosis.  No renal or ureteral stones seen.  No perinephric stranding is appreciated.  The small bowel is unremarkable in appearance.  The stomach is within normal limits.  No acute vascular abnormalities are seen.  The appendix is not generally seen; there is no evidence for appendicitis.  The cecum is flipped anteriorly.  The colon is partially filled with stool and is unremarkable in appearance.  The bladder is mildly distended and grossly unremarkable in appearance.  An enlarged fibroid uterus is noted, with multiple  prominent fibroids.  Wires are noted along both fallopian tubes.  A 3.8 cm right adnexal cystic lesion is noted; this is likely physiologic, though given the patient's right-sided symptoms, pelvic ultrasound would be helpful for further evaluation, when and as deemed clinically appropriate.  Trace free fluid within the pelvis is likely physiologic in nature.  No inguinal lymphadenopathy is seen.  No acute osseous abnormalities are identified.  IMPRESSION:  1.  3.8 cm right adnexal cystic lesion noted; this is likely physiologic, though given the patient's right-sided symptoms, pelvic ultrasound would be helpful for further evaluation, when and as deemed clinically appropriate. 2.  Unusual vague hypoattenuation at the head of the pancreas; though this could conceivably reflect minimal fatty infiltration, a poorly characterized pancreatic mass cannot be excluded.  MRCP would be helpful for further evaluation, when and as deemed clinically appropriate. 3.  Enlarged fibroid uterus noted.  Original  Report Authenticated By: Tonia Ghent, M.D.     1. Ovarian cyst   2. Fibroids       MDM  HPI/PE and clinical findings c/w 1. (R) ovarian cyst  2. Multiple uterine fibroids Both dx's likely contributing to pt's discomfort. Currently pain free. Labs unremarkable. Pt to schedule f/u w/ her GYN for post ablation f/u and further eval of #'s 1&2.         Leanne Chang, NP 10/03/11 1012

## 2011-10-01 NOTE — ED Notes (Signed)
Patient returned from CT

## 2011-10-01 NOTE — ED Notes (Signed)
Pt alert, c/o lower abd pain, radiates to right upper quad, onset this evening, denies changes in bowel or bladder, skin pwd, denies nausea or emesis

## 2011-10-01 NOTE — ED Notes (Signed)
Patient transported to CT 

## 2011-10-01 NOTE — ED Notes (Signed)
Pt has increased abdominal pain 10/10 after drinking contrast

## 2011-10-01 NOTE — Discharge Instructions (Signed)
Please review the instructions below. The CT of your abdomen tonight showed the tumor right ovarian cyst and uterine fibroids. This could certainly be the source of your pain. Uterine fibroids can also call for increased bleeding with your periods. You're mildly anemic but not dangerously so. Take medication for pain and nausea as directed and call Dr. Billy Coast today to arrange follow up as soon as can be arranged. Return if your symptoms worsen.  Fibroids Fibroids are lumps (tumors) that can occur any place in a woman's body. These lumps are not cancerous. Fibroids vary in size, weight, and where they grow. HOME CARE  Do not take aspirin.   Write down the number of pads or tampons you use during your period. Tell your doctor. This can help determine the best treatment for you.  GET HELP RIGHT AWAY IF:  You have pain in your lower belly (abdomen) that is not helped with medicine.   You have cramps that are not helped with medicine.   You have more bleeding between or during your period.   You feel lightheaded or pass out (faint).   Your lower belly pain gets worse.  MAKE SURE YOU:  Understand these instructions.   Will watch your condition.   Will get help right away if you are not doing well or get worse.  Document Released: 08/30/2010 Document Revised: 04/09/2011 Document Reviewed: 08/30/2010 [facility] Patient Information 2012 [place], [place].Ovarian Cyst The ovaries are small organs that are on each side of the uterus. The ovaries are the organs that produce the female hormones, estrogen and progesterone. An ovarian cyst is a sac filled with fluid that can vary in its size. It is normal for a small cyst to form in women who are in the childbearing age and who have menstrual periods. This type of cyst is called a follicle cyst that becomes an ovulation cyst (corpus luteum cyst) after it produces the women's egg. It later goes away on its own if the woman does not become pregnant. There  are other kinds of ovarian cysts that may cause problems and may need to be treated. The most serious problem is a cyst with cancer. It should be noted that menopausal women who have an ovarian cyst are at a higher risk of it being a cancer cyst. They should be evaluated very quickly, thoroughly and followed closely. This is especially true in menopausal women because of the high rate of ovarian cancer in women in menopause. CAUSES AND TYPES OF OVARIAN CYSTS:  FUNCTIONAL CYST: The follicle/corpus luteum cyst is a functional cyst that occurs every month during ovulation with the menstrual cycle. They go away with the next menstrual cycle if the woman does not get pregnant. Usually, there are no symptoms with a functional cyst.   ENDOMETRIOMA CYST: This cyst develops from the lining of the uterus tissue. This cyst gets in or on the ovary. It grows every month from the bleeding during the menstrual period. It is also called a "chocolate cyst" because it becomes filled with blood that turns brown. This cyst can cause pain in the lower abdomen during intercourse and with your menstrual period.   CYSTADENOMA CYST: This cyst develops from the cells on the outside of the ovary. They usually are not cancerous. They can get very big and cause lower abdomen pain and pain with intercourse. This type of cyst can twist on itself, cut off its blood supply and cause severe pain. It also can easily rupture and cause a  lot of pain.   DERMOID CYST: This type of cyst is sometimes found in both ovaries. They are found to have different kinds of body tissue in the cyst. The tissue includes skin, teeth, hair, and/or cartilage. They usually do not have symptoms unless they get very big. Dermoid cysts are rarely cancerous.   POLYCYSTIC OVARY: This is a rare condition with hormone problems that produces many small cysts on both ovaries. The cysts are follicle-like cysts that never produce an egg and become a corpus luteum. It can  cause an increase in body weight, infertility, acne, increase in body and facial hair and lack of menstrual periods or rare menstrual periods. Many women with this problem develop type 2 diabetes. The exact cause of this problem is unknown. A polycystic ovary is rarely cancerous.   THECA LUTEIN CYST: Occurs when too much hormone (human chorionic gonadotropin) is produced and over-stimulates the ovaries to produce an egg. They are frequently seen when doctors stimulate the ovaries for invitro-fertilization (test tube babies).   LUTEOMA CYST: This cyst is seen during pregnancy. Rarely it can cause an obstruction to the birth canal during labor and delivery. They usually go away after delivery.  SYMPTOMS   Pelvic pain or pressure.   Pain during sexual intercourse.   Increasing girth (swelling) of the abdomen.   Abnormal menstrual periods.   Increasing pain with menstrual periods.   You stop having menstrual periods and you are not pregnant.  DIAGNOSIS  The diagnosis can be made during:  Routine or annual pelvic examination (common).   Ultrasound.   X-ray of the pelvis.   CT Scan.   MRI.   Blood tests.  TREATMENT   Treatment may only be to follow the cyst monthly for 2 to 3 months with your caregiver. Many go away on their own, especially functional cysts.   May be aspirated (drained) with a long needle with ultrasound, or by laparoscopy (inserting a tube into the pelvis through a small incision).   The whole cyst can be removed by laparoscopy.   Sometimes the cyst may need to be removed through an incision in the lower abdomen.   Hormone treatment is sometimes used to help dissolve certain cysts.   Birth control pills are sometimes used to help dissolve certain cysts.  HOME CARE INSTRUCTIONS  Follow your caregiver's advice regarding:  Medicine.   Follow up visits to evaluate and treat the cyst.   You may need to come back or make an appointment with another caregiver,  to find the exact cause of your cyst, if your caregiver is not a gynecologist.   Get your yearly and recommended pelvic examinations and Pap tests.   Let your caregiver know if you have had an ovarian cyst in the past.  SEEK MEDICAL CARE IF:   Your periods are late, irregular, they stop, or are painful.   Your stomach (abdomen) or pelvic pain does not go away.   Your stomach becomes larger or swollen.   You have pressure on your bladder or trouble emptying your bladder completely.   You have painful sexual intercourse.   You have feelings of fullness, pressure, or discomfort in your stomach.   You lose weight for no apparent reason.   You feel generally ill.   You become constipated.   You lose your appetite.   You develop acne.   You have an increase in body and facial hair.   You are gaining weight, without changing your exercise and  eating habits.   You think you are pregnant.  SEEK IMMEDIATE MEDICAL CARE IF:   You have increasing abdominal pain.   You feel sick to your stomach (nausea) and/or vomit.   You develop a fever that comes on suddenly.   You develop abdominal pain during a bowel movement.   Your menstrual periods become heavier than usual.  Document Released: 07/28/2005 Document Revised: 04/09/2011 Document Reviewed: 05/31/2009 [facility] Patient Information 2012 [place], [place].Ovarian Cyst The ovaries are small organs that are on each side of the uterus. The ovaries are the organs that produce the female hormones, estrogen and progesterone. An ovarian cyst is a sac filled with fluid that can vary in its size. It is normal for a small cyst to form in women who are in the childbearing age and who have menstrual periods. This type of cyst is called a follicle cyst that becomes an ovulation cyst (corpus luteum cyst) after it produces the women's egg. It later goes away on its own if the woman does not become pregnant. There are other kinds of ovarian cysts that  may cause problems and may need to be treated. The most serious problem is a cyst with cancer. It should be noted that menopausal women who have an ovarian cyst are at a higher risk of it being a cancer cyst. They should be evaluated very quickly, thoroughly and followed closely. This is especially true in menopausal women because of the high rate of ovarian cancer in women in menopause. CAUSES AND TYPES OF OVARIAN CYSTS:  FUNCTIONAL CYST: The follicle/corpus luteum cyst is a functional cyst that occurs every month during ovulation with the menstrual cycle. They go away with the next menstrual cycle if the woman does not get pregnant. Usually, there are no symptoms with a functional cyst.   ENDOMETRIOMA CYST: This cyst develops from the lining of the uterus tissue. This cyst gets in or on the ovary. It grows every month from the bleeding during the menstrual period. It is also called a "chocolate cyst" because it becomes filled with blood that turns brown. This cyst can cause pain in the lower abdomen during intercourse and with your menstrual period.   CYSTADENOMA CYST: This cyst develops from the cells on the outside of the ovary. They usually are not cancerous. They can get very big and cause lower abdomen pain and pain with intercourse. This type of cyst can twist on itself, cut off its blood supply and cause severe pain. It also can easily rupture and cause a lot of pain.   DERMOID CYST: This type of cyst is sometimes found in both ovaries. They are found to have different kinds of body tissue in the cyst. The tissue includes skin, teeth, hair, and/or cartilage. They usually do not have symptoms unless they get very big. Dermoid cysts are rarely cancerous.   POLYCYSTIC OVARY: This is a rare condition with hormone problems that produces many small cysts on both ovaries. The cysts are follicle-like cysts that never produce an egg and become a corpus luteum. It can cause an increase in body weight,  infertility, acne, increase in body and facial hair and lack of menstrual periods or rare menstrual periods. Many women with this problem develop type 2 diabetes. The exact cause of this problem is unknown. A polycystic ovary is rarely cancerous.   THECA LUTEIN CYST: Occurs when too much hormone (human chorionic gonadotropin) is produced and over-stimulates the ovaries to produce an egg. They are frequently seen when  doctors stimulate the ovaries for invitro-fertilization (test tube babies).   LUTEOMA CYST: This cyst is seen during pregnancy. Rarely it can cause an obstruction to the birth canal during labor and delivery. They usually go away after delivery.  SYMPTOMS   Pelvic pain or pressure.   Pain during sexual intercourse.   Increasing girth (swelling) of the abdomen.   Abnormal menstrual periods.   Increasing pain with menstrual periods.   You stop having menstrual periods and you are not pregnant.  DIAGNOSIS  The diagnosis can be made during:  Routine or annual pelvic examination (common).   Ultrasound.   X-ray of the pelvis.   CT Scan.   MRI.   Blood tests.  TREATMENT   Treatment may only be to follow the cyst monthly for 2 to 3 months with your caregiver. Many go away on their own, especially functional cysts.   May be aspirated (drained) with a long needle with ultrasound, or by laparoscopy (inserting a tube into the pelvis through a small incision).   The whole cyst can be removed by laparoscopy.   Sometimes the cyst may need to be removed through an incision in the lower abdomen.   Hormone treatment is sometimes used to help dissolve certain cysts.   Birth control pills are sometimes used to help dissolve certain cysts.  HOME CARE INSTRUCTIONS  Follow your caregiver's advice regarding:  Medicine.   Follow up visits to evaluate and treat the cyst.   You may need to come back or make an appointment with another caregiver, to find the exact cause of your  cyst, if your caregiver is not a gynecologist.   Get your yearly and recommended pelvic examinations and Pap tests.   Let your caregiver know if you have had an ovarian cyst in the past.  SEEK MEDICAL CARE IF:   Your periods are late, irregular, they stop, or are painful.   Your stomach (abdomen) or pelvic pain does not go away.   Your stomach becomes larger or swollen.   You have pressure on your bladder or trouble emptying your bladder completely.   You have painful sexual intercourse.   You have feelings of fullness, pressure, or discomfort in your stomach.   You lose weight for no apparent reason.   You feel generally ill.   You become constipated.   You lose your appetite.   You develop acne.   You have an increase in body and facial hair.   You are gaining weight, without changing your exercise and eating habits.   You think you are pregnant.  SEEK IMMEDIATE MEDICAL CARE IF:   You have increasing abdominal pain.   You feel sick to your stomach (nausea) and/or vomit.   You develop a fever that comes on suddenly.   You develop abdominal pain during a bowel movement.   Your menstrual periods become heavier than usual.  Document Released: 07/28/2005 Document Revised: 04/09/2011 Document Reviewed: 05/31/2009 [facility] Patient Information 2012 [place], [place].

## 2011-10-03 NOTE — ED Provider Notes (Signed)
Medical screening examination/treatment/procedure(s) were conducted as a shared visit with non-physician practitioner(s) and myself.  I personally evaluated the patient during the encounter  Pt presents with abdominal pain which started in the mid and upper abdomen and radiated into the lower abdomen ove rthe course of the last few hours.  No hx of appy, no known cysts.  Pain is persistent, no f/cn/v.  PE:  Has ttp in the RLQ with no guarding or peritoneal signs, no other ttp.  No CVA ttp, normal heart and lung exam, no edema.  Well appearing.  Assessment - initially concern for appendicitis - however after imaging, likely fibroid type and non specific pain -specifically, the appendix does not appear to be source.  Pt informed of indications for return - expresses understanding.  Vida Roller, MD 10/03/11 Jerene Bears

## 2011-10-07 ENCOUNTER — Encounter (HOSPITAL_COMMUNITY): Payer: Self-pay

## 2012-06-01 ENCOUNTER — Other Ambulatory Visit: Payer: Self-pay | Admitting: Internal Medicine

## 2012-06-01 DIAGNOSIS — Z1231 Encounter for screening mammogram for malignant neoplasm of breast: Secondary | ICD-10-CM

## 2012-07-07 ENCOUNTER — Ambulatory Visit
Admission: RE | Admit: 2012-07-07 | Discharge: 2012-07-07 | Disposition: A | Discharge status: Home / Self Care | Payer: 59 | Source: Ambulatory Visit | Attending: Internal Medicine | Admitting: Internal Medicine

## 2012-07-07 DIAGNOSIS — Z1231 Encounter for screening mammogram for malignant neoplasm of breast: Secondary | ICD-10-CM

## 2012-12-29 ENCOUNTER — Emergency Department (HOSPITAL_COMMUNITY): Payer: 59

## 2012-12-29 ENCOUNTER — Emergency Department (HOSPITAL_COMMUNITY)
Admission: EM | Admit: 2012-12-29 | Discharge: 2012-12-29 | Disposition: A | Discharge status: Home / Self Care | Payer: 59 | Attending: Emergency Medicine | Admitting: Emergency Medicine

## 2012-12-29 ENCOUNTER — Encounter (HOSPITAL_COMMUNITY): Payer: Self-pay | Admitting: Emergency Medicine

## 2012-12-29 DIAGNOSIS — R51 Headache: Secondary | ICD-10-CM | POA: Insufficient documentation

## 2012-12-29 DIAGNOSIS — Z862 Personal history of diseases of the blood and blood-forming organs and certain disorders involving the immune mechanism: Secondary | ICD-10-CM | POA: Insufficient documentation

## 2012-12-29 DIAGNOSIS — M542 Cervicalgia: Secondary | ICD-10-CM | POA: Insufficient documentation

## 2012-12-29 DIAGNOSIS — K219 Gastro-esophageal reflux disease without esophagitis: Secondary | ICD-10-CM | POA: Insufficient documentation

## 2012-12-29 DIAGNOSIS — Y92410 Unspecified street and highway as the place of occurrence of the external cause: Secondary | ICD-10-CM | POA: Insufficient documentation

## 2012-12-29 DIAGNOSIS — M25519 Pain in unspecified shoulder: Secondary | ICD-10-CM | POA: Insufficient documentation

## 2012-12-29 DIAGNOSIS — Y9389 Activity, other specified: Secondary | ICD-10-CM | POA: Insufficient documentation

## 2012-12-29 DIAGNOSIS — R071 Chest pain on breathing: Secondary | ICD-10-CM | POA: Insufficient documentation

## 2012-12-29 DIAGNOSIS — Z3202 Encounter for pregnancy test, result negative: Secondary | ICD-10-CM | POA: Insufficient documentation

## 2012-12-29 LAB — POCT PREGNANCY, URINE: Preg Test, Ur: NEGATIVE

## 2012-12-29 MED ORDER — HYDROCODONE-ACETAMINOPHEN 5-325 MG PO TABS
1.0000 | ORAL_TABLET | Freq: Once | ORAL | Status: AC
  Administered 2012-12-29: 1 via ORAL
  Filled 2012-12-29: qty 1

## 2012-12-29 MED ORDER — IBUPROFEN 800 MG PO TABS: 800.0000 mg | ORAL_TABLET | Freq: Three times a day (TID) | ORAL | Status: DC
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
## 2012-12-29 NOTE — ED Notes (Signed)
Patient transported to CT 

## 2012-12-30 NOTE — ED Provider Notes (Signed)
Medical screening examination/treatment/procedure(s) were performed by non-physician practitioner and as supervising physician I was immediately available for consultation/collaboration.   Magdalyn Arenivas B. Devion Chriscoe, MD 12/30/12 0701 

## 2013-03-18 ENCOUNTER — Ambulatory Visit
Admission: RE | Admit: 2013-03-18 | Discharge: 2013-03-18 | Disposition: A | Discharge status: Home / Self Care | Payer: 59 | Source: Ambulatory Visit | Attending: Internal Medicine | Admitting: Internal Medicine

## 2013-03-18 ENCOUNTER — Other Ambulatory Visit: Payer: Self-pay | Admitting: Internal Medicine

## 2013-03-18 DIAGNOSIS — R609 Edema, unspecified: Secondary | ICD-10-CM

## 2013-05-03 ENCOUNTER — Other Ambulatory Visit: Payer: Self-pay | Admitting: Obstetrics and Gynecology

## 2013-05-06 ENCOUNTER — Encounter (HOSPITAL_COMMUNITY)
Admission: RE | Admit: 2013-05-06 | Discharge: 2013-05-06 | Disposition: A | Discharge status: Home / Self Care | Payer: 59 | Source: Ambulatory Visit | Attending: Obstetrics and Gynecology | Admitting: Obstetrics and Gynecology

## 2013-05-06 ENCOUNTER — Other Ambulatory Visit: Payer: Self-pay

## 2013-05-06 ENCOUNTER — Encounter (HOSPITAL_COMMUNITY): Payer: Self-pay

## 2013-05-06 DIAGNOSIS — Z01812 Encounter for preprocedural laboratory examination: Secondary | ICD-10-CM | POA: Insufficient documentation

## 2013-05-06 DIAGNOSIS — Z01818 Encounter for other preprocedural examination: Secondary | ICD-10-CM | POA: Insufficient documentation

## 2013-05-06 DIAGNOSIS — Z01810 Encounter for preprocedural cardiovascular examination: Secondary | ICD-10-CM | POA: Insufficient documentation

## 2013-05-06 HISTORY — DX: Essential (primary) hypertension: I10

## 2013-05-06 LAB — COMPREHENSIVE METABOLIC PANEL
AST: 16 U/L (ref 0–37)
Albumin: 3.5 g/dL (ref 3.5–5.2)
BUN: 7 mg/dL (ref 6–23)
Chloride: 104 mEq/L (ref 96–112)
Creatinine, Ser: 0.83 mg/dL (ref 0.50–1.10)
Total Protein: 7.1 g/dL (ref 6.0–8.3)

## 2013-05-06 LAB — CBC
HCT: 31.4 % — ABNORMAL LOW (ref 36.0–46.0)
MCHC: 31.2 g/dL (ref 30.0–36.0)
MCV: 77.7 fL — ABNORMAL LOW (ref 78.0–100.0)
Platelets: 259 10*3/uL (ref 150–400)
RDW: 16.7 % — ABNORMAL HIGH (ref 11.5–15.5)
WBC: 7.3 10*3/uL (ref 4.0–10.5)

## 2013-05-06 NOTE — Patient Instructions (Addendum)
Your procedure is scheduled on:05/16/13  Enter through the Main Entrance at :0815 am Pick up desk phone and dial [phone] and inform us of your arrival.  Please call [phone] if you have any problems the morning of surgery.  Remember: Do not eat food or drink liquids, including water, after midnight: Sunday  You may brush your teeth the morning of surgery.  Take these meds the morning of surgery with a sip of water:BP meds, Omeprazole  DO NOT wear jewelry, eye make-up, lipstick,body lotion, or dark fingernail polish.  (Polished toes are ok) You may wear deodorant.  If you are to be admitted after surgery, leave suitcase in car until your room has been assigned. Patients discharged on the day of surgery will not be allowed to drive home. Wear loose fitting, comfortable clothes for your ride home.

## 2013-05-11 ENCOUNTER — Encounter (HOSPITAL_COMMUNITY): Payer: Self-pay | Admitting: Pharmacist

## 2013-05-11 MED ORDER — LIDOCAINE-EPINEPHRINE (PF) 1 %-1:200000 IJ SOLN
INTRAMUSCULAR | Status: AC
  Filled 2013-05-11: qty 20

## 2013-05-15 NOTE — H&P (Signed)
Alicia Patel, Alicia Patel NO.:  [id number]  MEDICAL RECORD NO.:  [id number]  LOCATION:  PERIO                         FACILITY:  [facility]  PHYSICIAN:  Lenoard Aden, M.D.DATE OF BIRTH:  April 13, 1971  DATE OF ADMISSION:  04/18/2013 DATE OF DISCHARGE:                             HISTORY & PHYSICAL   CHIEF COMPLAINT:  Refractory menorrhagia with secondary anemia.  HISTORY OF PRESENT ILLNESS:  She is a 42 year old African American female, G2, P1 with a history of failed NovaSure ablation in January 2013, who presents now with persistent menorrhagia, secondary anemia, and known uterine fibroids for definitive therapy.  ALLERGIES:  SULFA, CODEINE.  MEDICATIONS:  Omeprazole and multivitamin.  FAMILY HISTORY:  Breast cancer, diabetes, heart disease, hypertension.  PERSONAL HISTORY:  C-section x1, miscarriage x1.  SURGICAL HISTORY:  NovaSure, diagnostic hysteroscopy, Essure, and cesarean section.  PHYSICAL EXAMINATION:  GENERAL:  She is a well-developed, well-nourished [place] American female, in no acute distress. VITAL SIGNS:  Height of 64 inches, weight of 181 pounds. HEENT:  Normal. NECK:  Supple.  Full range of motion. LUNGS:  Clear. HEART:  Regular rate and rhythm. ABDOMEN:  Soft and nontender. PELVIC:  Reveals an enlarged uterus, 12-14 weeks' size. EXTREMITIES:  No cords. NEUROLOGIC:  Nonfocal. SKIN:  Intact.  IMPRESSION:  Symptomatic uterine fibroids, status post failed NovaSure endometrial ablation with persistent anemia secondary to menorrhagia.  PLAN:  To proceed with da Vinci assisted total laparoscopic hysterectomy, bilateral salpingectomy.  Risks of anesthesia, infection, bleeding, injury to surrounding organs, possible need for repair discussed, delayed versus immediate complications to include bowel and bladder injury noted.  The patient acknowledges and wishes to proceed. In addition, secondary morcellation consent is signed.  The  patient acknowledges possible need for uterine morcellation due to marked uterine enlargement.  She is educated about the recent FDA statement noting concerns of uterine morcellation of unknown uterine sarcoma, although FDA data has suggested a possible rate of uterine sarcoma of approximately 1 in 300, more relevant data suggest a risk of approximately 1 in 1000.  She acknowledges the minor risks of undiagnosed uterine sarcoma and with possible need for morcellation, attempts will be made to remove the uterus intact.  However, she acknowledges and does consent to morcellation as needed.     Lenoard Aden, M.D.     RJT/MEDQ  D:  05/15/2013  T:  05/15/2013  Job:  [id number]

## 2013-05-16 ENCOUNTER — Encounter (HOSPITAL_COMMUNITY): Payer: Self-pay | Admitting: Anesthesiology

## 2013-05-16 ENCOUNTER — Ambulatory Visit (HOSPITAL_COMMUNITY): Payer: 59 | Admitting: Anesthesiology

## 2013-05-16 ENCOUNTER — Ambulatory Visit (HOSPITAL_COMMUNITY)
Admission: RE | Admit: 2013-05-16 | Discharge: 2013-05-17 | Disposition: A | Discharge status: Home / Self Care | Payer: 59 | Source: Ambulatory Visit | Attending: Obstetrics and Gynecology | Admitting: Obstetrics and Gynecology

## 2013-05-16 ENCOUNTER — Encounter (HOSPITAL_COMMUNITY)
Admission: RE | Disposition: A | Discharge status: Home / Self Care | Payer: Self-pay | Source: Ambulatory Visit | Attending: Obstetrics and Gynecology

## 2013-05-16 DIAGNOSIS — N9489 Other specified conditions associated with female genital organs and menstrual cycle: Secondary | ICD-10-CM | POA: Insufficient documentation

## 2013-05-16 DIAGNOSIS — Z90710 Acquired absence of both cervix and uterus: Secondary | ICD-10-CM | POA: Diagnosis not present

## 2013-05-16 DIAGNOSIS — D500 Iron deficiency anemia secondary to blood loss (chronic): Secondary | ICD-10-CM | POA: Insufficient documentation

## 2013-05-16 DIAGNOSIS — N838 Other noninflammatory disorders of ovary, fallopian tube and broad ligament: Secondary | ICD-10-CM | POA: Insufficient documentation

## 2013-05-16 DIAGNOSIS — D250 Submucous leiomyoma of uterus: Secondary | ICD-10-CM | POA: Insufficient documentation

## 2013-05-16 DIAGNOSIS — N8300 Follicular cyst of ovary, unspecified side: Secondary | ICD-10-CM | POA: Insufficient documentation

## 2013-05-16 DIAGNOSIS — N920 Excessive and frequent menstruation with regular cycle: Principal | ICD-10-CM | POA: Insufficient documentation

## 2013-05-16 DIAGNOSIS — N803 Endometriosis of pelvic peritoneum, unspecified: Secondary | ICD-10-CM | POA: Insufficient documentation

## 2013-05-16 DIAGNOSIS — N852 Hypertrophy of uterus: Secondary | ICD-10-CM | POA: Insufficient documentation

## 2013-05-16 DIAGNOSIS — D252 Subserosal leiomyoma of uterus: Secondary | ICD-10-CM | POA: Insufficient documentation

## 2013-05-16 DIAGNOSIS — D251 Intramural leiomyoma of uterus: Secondary | ICD-10-CM | POA: Insufficient documentation

## 2013-05-16 DIAGNOSIS — N831 Corpus luteum cyst of ovary, unspecified side: Secondary | ICD-10-CM | POA: Insufficient documentation

## 2013-05-16 HISTORY — DX: Acquired absence of both cervix and uterus: Z90.710

## 2013-05-16 HISTORY — PX: ROBOTIC ASSISTED TOTAL HYSTERECTOMY: SHX6085

## 2013-05-16 HISTORY — PX: BILATERAL SALPINGECTOMY: SHX5743

## 2013-05-16 LAB — TYPE AND SCREEN
ABO/RH(D): A POS
Antibody Screen: NEGATIVE

## 2013-05-16 LAB — HCG, SERUM, QUALITATIVE: Preg, Serum: NEGATIVE

## 2013-05-16 LAB — ABO/RH: ABO/RH(D): A POS

## 2013-05-16 SURGERY — ROBOTIC ASSISTED TOTAL HYSTERECTOMY
Anesthesia: General | Site: Abdomen | Wound class: Clean Contaminated

## 2013-05-16 MED ORDER — HYDROMORPHONE 0.3 MG/ML IV SOLN
INTRAVENOUS | Status: DC
  Administered 2013-05-16: 0.799 mg via INTRAVENOUS
  Administered 2013-05-16: 15:00:00 via INTRAVENOUS
  Administered 2013-05-16: 1.19 mg via INTRAVENOUS
  Administered 2013-05-17: 0.999 mg via INTRAVENOUS
  Administered 2013-05-17: 0.599 mg via INTRAVENOUS
  Administered 2013-05-17: 0.4 mg via INTRAVENOUS
  Filled 2013-05-16: qty 25

## 2013-05-16 MED ORDER — ROCURONIUM BROMIDE 100 MG/10ML IV SOLN
INTRAVENOUS | Status: DC | PRN
  Administered 2013-05-16: 20 mg via INTRAVENOUS
  Administered 2013-05-16: 50 mg via INTRAVENOUS
  Administered 2013-05-16: 20 mg via INTRAVENOUS

## 2013-05-16 MED ORDER — GLYCOPYRROLATE 0.2 MG/ML IJ SOLN
INTRAMUSCULAR | Status: AC
  Filled 2013-05-16: qty 4

## 2013-05-16 MED ORDER — TRAMADOL HCL 50 MG PO TABS
50.0000 mg | ORAL_TABLET | Freq: Four times a day (QID) | ORAL | Status: DC | PRN
  Filled 2013-05-16: qty 1

## 2013-05-16 MED ORDER — GLYCOPYRROLATE 0.2 MG/ML IJ SOLN
INTRAMUSCULAR | Status: AC
  Filled 2013-05-16: qty 1

## 2013-05-16 MED ORDER — LIDOCAINE HCL (CARDIAC) 20 MG/ML IV SOLN
INTRAVENOUS | Status: AC
  Filled 2013-05-16: qty 5

## 2013-05-16 MED ORDER — ONDANSETRON HCL 4 MG/2ML IJ SOLN
INTRAMUSCULAR | Status: AC
  Filled 2013-05-16: qty 2

## 2013-05-16 MED ORDER — DEXAMETHASONE SODIUM PHOSPHATE 10 MG/ML IJ SOLN
INTRAMUSCULAR | Status: AC
  Filled 2013-05-16: qty 1

## 2013-05-16 MED ORDER — NALOXONE HCL 0.4 MG/ML IJ SOLN: 0.4000 mg | INTRAMUSCULAR | Status: DC | PRN

## 2013-05-16 MED ORDER — LACTATED RINGERS IV SOLN
INTRAVENOUS | Status: DC
  Administered 2013-05-16 (×2): via INTRAVENOUS

## 2013-05-16 MED ORDER — ONDANSETRON HCL 4 MG/2ML IJ SOLN
4.0000 mg | Freq: Four times a day (QID) | INTRAMUSCULAR | Status: DC | PRN
  Administered 2013-05-17: 4 mg via INTRAVENOUS
  Filled 2013-05-16: qty 2

## 2013-05-16 MED ORDER — FENTANYL CITRATE 0.05 MG/ML IJ SOLN
INTRAMUSCULAR | Status: DC | PRN
  Administered 2013-05-16 (×3): 100 ug via INTRAVENOUS
  Administered 2013-05-16: 150 ug via INTRAVENOUS
  Administered 2013-05-16: 50 ug via INTRAVENOUS

## 2013-05-16 MED ORDER — GLYCOPYRROLATE 0.2 MG/ML IJ SOLN
INTRAMUSCULAR | Status: DC | PRN
  Administered 2013-05-16: .8 mg via INTRAVENOUS

## 2013-05-16 MED ORDER — ARTIFICIAL TEARS OP OINT
TOPICAL_OINTMENT | OPHTHALMIC | Status: AC
  Filled 2013-05-16: qty 3.5

## 2013-05-16 MED ORDER — DEXAMETHASONE SODIUM PHOSPHATE 4 MG/ML IJ SOLN
INTRAMUSCULAR | Status: DC | PRN
  Administered 2013-05-16: 10 mg via INTRAVENOUS

## 2013-05-16 MED ORDER — HYDROMORPHONE HCL PF 1 MG/ML IJ SOLN
INTRAMUSCULAR | Status: AC
  Administered 2013-05-16: 0.5 mg via INTRAVENOUS
  Filled 2013-05-16: qty 1

## 2013-05-16 MED ORDER — DIPHENHYDRAMINE HCL 12.5 MG/5ML PO ELIX: 12.5000 mg | ORAL_SOLUTION | Freq: Four times a day (QID) | ORAL | Status: DC | PRN

## 2013-05-16 MED ORDER — HYDROMORPHONE HCL PF 1 MG/ML IJ SOLN
0.2500 mg | INTRAMUSCULAR | Status: DC | PRN
  Administered 2013-05-16 (×2): 0.5 mg via INTRAVENOUS

## 2013-05-16 MED ORDER — ROPIVACAINE HCL 5 MG/ML IJ SOLN
INTRAMUSCULAR | Status: DC | PRN
  Administered 2013-05-16: 120 mL

## 2013-05-16 MED ORDER — DEXTROSE IN LACTATED RINGERS 5 % IV SOLN
INTRAVENOUS | Status: DC
  Administered 2013-05-16 – 2013-05-17 (×2): via INTRAVENOUS

## 2013-05-16 MED ORDER — SODIUM CHLORIDE 0.9 % IJ SOLN: 9.0000 mL | INTRAMUSCULAR | Status: DC | PRN

## 2013-05-16 MED ORDER — CEFAZOLIN SODIUM-DEXTROSE 2-3 GM-% IV SOLR
INTRAVENOUS | Status: AC
  Filled 2013-05-16: qty 50

## 2013-05-16 MED ORDER — CARVEDILOL 12.5 MG PO TABS
12.5000 mg | ORAL_TABLET | Freq: Two times a day (BID) | ORAL | Status: DC
  Administered 2013-05-17: 12.5 mg via ORAL
  Filled 2013-05-16 (×2): qty 1

## 2013-05-16 MED ORDER — ROPIVACAINE HCL 5 MG/ML IJ SOLN
INTRAMUSCULAR | Status: AC
  Filled 2013-05-16: qty 60

## 2013-05-16 MED ORDER — PROPOFOL 10 MG/ML IV EMUL
INTRAVENOUS | Status: AC
  Filled 2013-05-16: qty 20

## 2013-05-16 MED ORDER — ONDANSETRON HCL 4 MG/2ML IJ SOLN
INTRAMUSCULAR | Status: DC | PRN
  Administered 2013-05-16: 4 mg via INTRAMUSCULAR

## 2013-05-16 MED ORDER — NEOSTIGMINE METHYLSULFATE 1 MG/ML IJ SOLN
INTRAMUSCULAR | Status: DC | PRN
  Administered 2013-05-16: 4 mg via INTRAVENOUS

## 2013-05-16 MED ORDER — CEFAZOLIN SODIUM-DEXTROSE 2-3 GM-% IV SOLR
2.0000 g | INTRAVENOUS | Status: AC
  Administered 2013-05-16: 2 g via INTRAVENOUS

## 2013-05-16 MED ORDER — FENTANYL CITRATE 0.05 MG/ML IJ SOLN
INTRAMUSCULAR | Status: AC
  Filled 2013-05-16: qty 5

## 2013-05-16 MED ORDER — ZOLPIDEM TARTRATE 5 MG PO TABS: 5.0000 mg | ORAL_TABLET | Freq: Every evening | ORAL | Status: DC | PRN

## 2013-05-16 MED ORDER — MIDAZOLAM HCL 2 MG/2ML IJ SOLN
INTRAMUSCULAR | Status: AC
  Filled 2013-05-16: qty 2

## 2013-05-16 MED ORDER — MIDAZOLAM HCL 5 MG/5ML IJ SOLN
INTRAMUSCULAR | Status: DC | PRN
  Administered 2013-05-16: 2 mg via INTRAVENOUS

## 2013-05-16 MED ORDER — LACTATED RINGERS IR SOLN
Status: DC | PRN
  Administered 2013-05-16: 3000 mL

## 2013-05-16 MED ORDER — DIPHENHYDRAMINE HCL 50 MG/ML IJ SOLN: 12.5000 mg | Freq: Four times a day (QID) | INTRAMUSCULAR | Status: DC | PRN

## 2013-05-16 MED ORDER — NEOSTIGMINE METHYLSULFATE 1 MG/ML IJ SOLN
INTRAMUSCULAR | Status: AC
  Filled 2013-05-16: qty 1

## 2013-05-16 MED ORDER — LIDOCAINE HCL (CARDIAC) 20 MG/ML IV SOLN
INTRAVENOUS | Status: DC | PRN
  Administered 2013-05-16: 60 mg via INTRAVENOUS

## 2013-05-16 SURGICAL SUPPLY — 62 items
ADH SKN CLS APL DERMABOND .7 (GAUZE/BANDAGES/DRESSINGS) ×2
BAG URINE DRAINAGE (UROLOGICAL SUPPLIES) ×3 IMPLANT
BARRIER ADHS 3X4 INTERCEED (GAUZE/BANDAGES/DRESSINGS) ×3 IMPLANT
BRR ADH 4X3 ABS CNTRL BYND (GAUZE/BANDAGES/DRESSINGS) ×2
CATH FOLEY 3WAY  5CC 16FR (CATHETERS) ×1
CATH FOLEY 3WAY 5CC 16FR (CATHETERS) ×2 IMPLANT
CHLORAPREP W/TINT 26ML (MISCELLANEOUS) ×3 IMPLANT
CLOTH BEACON ORANGE TIMEOUT ST (SAFETY) ×3 IMPLANT
CONT PATH 16OZ SNAP LID 3702 (MISCELLANEOUS) ×3 IMPLANT
COVER MAYO STAND STRL (DRAPES) ×3 IMPLANT
COVER TABLE BACK 60X90 (DRAPES) ×6 IMPLANT
COVER TIP SHEARS 8 DVNC (MISCELLANEOUS) ×2 IMPLANT
COVER TIP SHEARS 8MM DA VINCI (MISCELLANEOUS) ×1
DECANTER SPIKE VIAL GLASS SM (MISCELLANEOUS) ×3 IMPLANT
DERMABOND ADVANCED (GAUZE/BANDAGES/DRESSINGS) ×1
DERMABOND ADVANCED .7 DNX12 (GAUZE/BANDAGES/DRESSINGS) ×2 IMPLANT
DRAPE HUG U DISPOSABLE (DRAPE) ×3 IMPLANT
DRAPE LG THREE QUARTER DISP (DRAPES) ×6 IMPLANT
DRAPE WARM FLUID 44X44 (DRAPE) ×3 IMPLANT
ELECT REM PT RETURN 9FT ADLT (ELECTROSURGICAL) ×3
ELECTRODE REM PT RTRN 9FT ADLT (ELECTROSURGICAL) ×2 IMPLANT
EVACUATOR SMOKE 8.L (FILTER) ×3 IMPLANT
GAUZE VASELINE 3X9 (GAUZE/BANDAGES/DRESSINGS) IMPLANT
GLOVE BIO SURGEON STRL SZ7.5 (GLOVE) ×6 IMPLANT
GOWN STRL REIN XL XLG (GOWN DISPOSABLE) ×18 IMPLANT
GYRUS RUMI II 2.5CM BLUE (DISPOSABLE)
GYRUS RUMI II 3.5CM BLUE (DISPOSABLE)
GYRUS RUMI II 4.0CM BLUE (DISPOSABLE)
KIT ACCESSORY DA VINCI DISP (KITS) ×1
KIT ACCESSORY DVNC DISP (KITS) ×2 IMPLANT
LEGGING LITHOTOMY PAIR STRL (DRAPES) ×3 IMPLANT
NEEDLE INSUFFLATION 150MM (ENDOMECHANICALS) ×3 IMPLANT
PACK LAVH (CUSTOM PROCEDURE TRAY) ×3 IMPLANT
PAD PREP 24X48 CUFFED NSTRL (MISCELLANEOUS) ×6 IMPLANT
PLUG CATH AND CAP STER (CATHETERS) ×3 IMPLANT
PROTECTOR NERVE ULNAR (MISCELLANEOUS) ×6 IMPLANT
RUMI II 3.0CM BLUE KOH-EFFICIE (DISPOSABLE) ×1 IMPLANT
RUMI II GYRUS 2.5CM BLUE (DISPOSABLE) IMPLANT
RUMI II GYRUS 3.5CM BLUE (DISPOSABLE) IMPLANT
RUMI II GYRUS 4.0CM BLUE (DISPOSABLE) IMPLANT
SET CYSTO W/LG BORE CLAMP LF (SET/KITS/TRAYS/PACK) IMPLANT
SET IRRIG TUBING LAPAROSCOPIC (IRRIGATION / IRRIGATOR) ×3 IMPLANT
SOLUTION ELECTROLUBE (MISCELLANEOUS) ×3 IMPLANT
SUT VIC AB 0 CT1 27 (SUTURE) ×6
SUT VIC AB 0 CT1 27XBRD ANBCTR (SUTURE) ×4 IMPLANT
SUT VICRYL 0 UR6 27IN ABS (SUTURE) ×3 IMPLANT
SUT VICRYL RAPIDE 4/0 PS 2 (SUTURE) ×6 IMPLANT
SUT VLOC 180 0 9IN  GS21 (SUTURE) ×1
SUT VLOC 180 0 9IN GS21 (SUTURE) IMPLANT
SYR 50ML LL SCALE MARK (SYRINGE) ×3 IMPLANT
SYRINGE 10CC LL (SYRINGE) ×3 IMPLANT
TIP RUMI ORANGE 6.7MMX12CM (TIP) ×1 IMPLANT
TOWEL OR 17X24 6PK STRL BLUE (TOWEL DISPOSABLE) ×9 IMPLANT
TROCAR BLADELESS OPT 12M 100M (ENDOMECHANICALS) IMPLANT
TROCAR DISP BLADELESS 8 DVNC (TROCAR) ×2 IMPLANT
TROCAR DISP BLADELESS 8MM (TROCAR) ×1
TROCAR XCEL 12X100 BLDLESS (ENDOMECHANICALS) IMPLANT
TROCAR XCEL NON-BLD 5MMX100MML (ENDOMECHANICALS) ×3 IMPLANT
TROCAR Z-THREAD 12X150 (TROCAR) ×3 IMPLANT
TUBING FILTER THERMOFLATOR (ELECTROSURGICAL) ×3 IMPLANT
WARMER LAPAROSCOPE (MISCELLANEOUS) ×3 IMPLANT
WATER STERILE IRR 1000ML POUR (IV SOLUTION) ×9 IMPLANT

## 2013-05-16 NOTE — Progress Notes (Signed)
Patient ID: Alicia Patel, female   DOB: September 08, 1970, 42 y.o.   MRN: [id number] Patient seen and examined. Consent witnessed and signed. No changes noted. Update completed.

## 2013-05-16 NOTE — Op Note (Signed)
05/16/2013  12:28 PM  PATIENT:  Alicia Patel  42 y.o. female  PRE-OPERATIVE DIAGNOSIS:  Menorrhagia, Fibroids  [id number]  POST-OPERATIVE DIAGNOSIS:  Menorrhagia, fibroids,Endometriosis  PROCEDURE:  Procedure(s): ROBOTIC ASSISTED TOTAL HYSTERECTOMY WITH LEFT OOPORECTOMY BILATERAL SALPINGECTOMY LYSIS OF PELVIC ADHESIONS RIGHT OVARIAN CYSTECTOMY ABLATION OF CUL DE SAC ENDOMETRIOSIS LEFT URETEROLYSIS MCCALL CUL DE PLASTY  SURGEON:  Surgeon(s): Lenoard Aden, MD Sheronette Cathie Beams, MD  ASSISTANTS: Cherly Hensen, MD   ANESTHESIA:   local and general  ESTIMATED BLOOD LOSS: * No blood loss amount entered *   DRAINS: Urinary Catheter (Foley)   LOCAL MEDICATIONS USED:  MARCAINE     SPECIMEN:  Source of Specimen:  uterus, cervix, bilateral tubes. left ovary , right ovarian cyst wall  DISPOSITION OF SPECIMEN:  PATHOLOGY  COUNTS:  YES  DICTATION #: [id number]  PLAN OF CARE: DC home today  PATIENT DISPOSITION:  PACU - hemodynamically stable.

## 2013-05-16 NOTE — Anesthesia Postprocedure Evaluation (Signed)
  Anesthesia Post-op Note  Patient: Alicia Patel  Procedure(s) Performed: Procedure(s): ROBOTIC ASSISTED TOTAL HYSTERECTOMY WITH LEFT OOPORECTOMY (N/A) BILATERAL SALPINGECTOMY (Bilateral) Patient is awake and responsive. Pain and nausea are reasonably well controlled. Vital signs are stable and clinically acceptable. Oxygen saturation is clinically acceptable. There are no apparent anesthetic complications at this time. Patient is ready for discharge.

## 2013-05-16 NOTE — Anesthesia Preprocedure Evaluation (Addendum)
Anesthesia Evaluation  Patient identified by MRN, date of birth, ID band Patient awake    Reviewed: Allergy & Precautions, H&P , Patient's Chart, lab work & pertinent test results, reviewed documented beta blocker date and time   Airway Mallampati: II TM Distance: >3 FB Neck ROM: full    Dental no notable dental hx.    Pulmonary  breath sounds clear to auscultation  Pulmonary exam normal       Cardiovascular hypertension (well controlled), On Medications and On Home Beta Blockers Rhythm:regular Rate:Normal     Neuro/Psych    GI/Hepatic GERD-  ,  Endo/Other    Renal/GU      Musculoskeletal   Abdominal   Peds  Hematology  (+) anemia ,   Anesthesia Other Findings   Reproductive/Obstetrics                           Anesthesia Physical Anesthesia Plan  ASA: II  Anesthesia Plan: General   Post-op Pain Management:    Induction: Intravenous  Airway Management Planned: Oral ETT  Additional Equipment:   Intra-op Plan:   Post-operative Plan: Extubation in OR  Informed Consent: I have reviewed the patients History and Physical, chart, labs and discussed the procedure including the risks, benefits and alternatives for the proposed anesthesia with the patient or authorized representative who has indicated his/her understanding and acceptance.   Dental Advisory Given and Dental advisory given  Plan Discussed with: CRNA and Surgeon  Anesthesia Plan Comments: (  Discussed general anesthesia, including possible nausea, instrumentation of airway, sore throat,pulmonary aspiration, etc. I asked if the were any outstanding questions, or  concerns before we proceeded. )        Anesthesia Quick Evaluation

## 2013-05-16 NOTE — Transfer of Care (Signed)
Immediate Anesthesia Transfer of Care Note  Patient: Alicia Patel  Procedure(s) Performed: Procedure(s): ROBOTIC ASSISTED TOTAL HYSTERECTOMY WITH LEFT OOPORECTOMY (N/A) BILATERAL SALPINGECTOMY (Bilateral)  Patient Location: PACU  Anesthesia Type:General  Level of Consciousness: awake, oriented and sedated  Airway & Oxygen Therapy: Patient Spontanous Breathing and Patient connected to nasal cannula oxygen  Post-op Assessment: Report given to PACU RN and Post -op Vital signs reviewed and stable  Post vital signs: Reviewed and stable  Complications: No apparent anesthesia complications

## 2013-05-17 ENCOUNTER — Encounter (HOSPITAL_COMMUNITY): Payer: Self-pay | Admitting: Obstetrics and Gynecology

## 2013-05-17 DIAGNOSIS — Z90710 Acquired absence of both cervix and uterus: Secondary | ICD-10-CM

## 2013-05-17 HISTORY — DX: Acquired absence of both cervix and uterus: Z90.710

## 2013-05-17 LAB — BASIC METABOLIC PANEL
BUN: 6 mg/dL (ref 6–23)
Chloride: 102 mEq/L (ref 96–112)
Creatinine, Ser: 0.68 mg/dL (ref 0.50–1.10)
GFR calc Af Amer: >90 mL/min (ref >90)
GFR calc non Af Amer: >90 mL/min (ref >90)
Sodium: 135 mEq/L (ref 135–145)

## 2013-05-17 LAB — CBC
HCT: 29.4 % — ABNORMAL LOW (ref 36.0–46.0)
MCV: 77.4 fL — ABNORMAL LOW (ref 78.0–100.0)
Platelets: 181 10*3/uL (ref 150–400)
RBC: 3.8 MIL/uL — ABNORMAL LOW (ref 3.87–5.11)
RDW: 16.4 % — ABNORMAL HIGH (ref 11.5–15.5)
WBC: 12.3 10*3/uL — ABNORMAL HIGH (ref 4.0–10.5)

## 2013-05-17 MED ORDER — OXYCODONE-ACETAMINOPHEN 5-325 MG PO TABS
1.0000 | ORAL_TABLET | ORAL | Status: DC | PRN
  Administered 2013-05-17: 2 via ORAL
  Filled 2013-05-17: qty 2

## 2013-05-17 MED ORDER — TRAMADOL HCL 50 MG PO TABS: 50.0000 mg | ORAL_TABLET | Freq: Four times a day (QID) | ORAL | Status: DC | PRN

## 2013-05-17 MED ORDER — OXYCODONE-ACETAMINOPHEN 5-325 MG PO TABS: 1.0000 | ORAL_TABLET | ORAL | Status: DC | PRN

## 2013-05-17 NOTE — Op Note (Signed)
Alicia Patel, Patel NO.:  [id number]  MEDICAL RECORD NO.:  [id number]  LOCATION:  9305                          FACILITY:  [facility]  PHYSICIAN:  Lenoard Aden, M.D.DATE OF BIRTH:  04-Jul-1971  DATE OF PROCEDURE:  05/16/2013 DATE OF DISCHARGE:                              OPERATIVE REPORT   DESCRIPTION OF PROCEDURE:  After being apprised of risks of anesthesia, infection, bleeding, injury to surrounding organs, possible need for repair, delayed versus immediate complications to include bowel and bladder injury, possible need for repair, possible need for blood transfusion, possible need for uterine morcellation.  The patient was brought to the operating room.  After consents were signed, she was administered general anesthetic without complications. Prepped and draped in usual sterile fashion.  Foley catheter placed.  An exam under anesthesia revealed a 12-14 week size irregularly shaped uterus and no adnexal masses. A RUMI retractor was placed per vagina. Foley catheter was placed per vagina without difficulty.  Supraumbilical incision was made with a scalpel.  Veress needle placed, opening pressure -2; 4 L CO2 was insufflated without difficulty.  Visualization revealed multiple uterine fibroids.  Bowel adhesions to the left adnexa, cul-de-sac, with extensive endometriosis and adhesions of the left ovary to the left pelvic sidewall.  The liver, gallbladder bed appear normal. Normal appendiceal area.  At this time, the two 8-mm ports were placed on the right and 5 on the left.  Under direct visualization, Trendelenburg positioning having been established, atraumatic trocar entry having been assured.  At this time, the robotic endo-shears, robotic ProGrasp, and robotic malar dissector are placed in standard fashion.  The robot was docked in the right angle docking method.  At this time, the adhesions at the bowel to the left pelvic sidewall are sharply lysed and  dissected off the left sidewall freeing this completely.  The left retroperitoneal space was then entered.  The ureter was identified.  The infundibulopelvic ligament is noted on the left, was skeletonized, cauterized, and divided.  Urethrolysis is made to expose the left ureter as it extends into the area of endometriosis where the left ovary is adherent to the left pelvic sidewall.  This area is freed up without difficulty, and then the left mesosalpinx was separated.  The left ovarian ligament is separated and the left ovarian specimen is completely detached.  At this time, the round ligament on the left is divided.  The uterine vessels on the left was skeletonized and the bladder flap was sharply developed.  Some bladder adhesions on the left were noted as well.  The ureter seemed to be well away from the left area of dissection.  At this time, on the right side, the right ovary appears normal.  Right ovarian cystectomy was performed in standard fashion after making a linear incision and clear fluid was noted.  Good hemostasis was noted.  Right ovary otherwise appears normal.  The retroperitoneal space was entered on the right.  The right medial salpinx was cauterized dividing the right tube and ovarian ligament cauterized and divided.  The round ligament opened.  The bladder flap further developed and the right ovarian vessels skeletonized.  The vessels on the left have  now been cauterized, but not divided.  The vessels on the right were cauterized and divided.  The adhesions along the posterior cul-de-sac are further divided, ablation of endometriosis in the cul-de-sac was performed as well.  The RUMI cup was identified, palpable through the vaginal cuff, and after complete development of the bladder flap, it was circumscribed in a circumferential fashion, dividing the specimen.  The specimen was then divided and elongated, so that it would fit through the vagina and this was done  using Endoshears on pure cutting.  At this time, the specimen was completely retracted into the vagina without difficulty.  Good hemostasis was noted.  A 0 Quill suture was used to close the vagina in a continuous running fashion and culdoplasty suture was placed.  Good hemostasis was noted.  Ureters were seen to be peristalsing normally bilaterally.  The robot was then undocked.  CO2 released.  Incisions were closed using a 0 Vicryl, 4-0 Vicryl, and Dermabond.  The patient tolerated the procedure well, was awakened, and transferred to the recovery in good condition.     Lenoard Aden, M.D.     RJT/MEDQ  D:  05/16/2013  T:  05/17/2013  Job:  [id number]

## 2013-05-17 NOTE — Anesthesia Postprocedure Evaluation (Signed)
  Anesthesia Post-op Note  Patient: Alicia Patel  Procedure(s) Performed: Procedure(s): ROBOTIC ASSISTED TOTAL HYSTERECTOMY WITH LEFT OOPORECTOMY (N/A) BILATERAL SALPINGECTOMY (Bilateral) Patient is awake and responsive. Pain and nausea are reasonably well controlled. Vital signs are stable and clinically acceptable. Oxygen saturation is clinically acceptable. There are no apparent anesthetic complications at this time. Patient is ready for discharge.   

## 2013-05-17 NOTE — Progress Notes (Signed)
PCA Dilaudid 0.3mg /ml concentration- 9ml wasted in sink with Inis Sizer RN as witness. Unable to witness at [facility] machine due to change over of system. Spoke with Pharmacy regarding interim waste documentation.

## 2013-05-17 NOTE — Progress Notes (Signed)
Pt discharged to home with daughter. Condition stable. Patient ambulated to car with Stevphen Meuse, NT. No equipment for home ordered at discharge.

## 2013-05-17 NOTE — Anesthesia Postprocedure Evaluation (Signed)
  Anesthesia Post-op Note  Patient: Alicia Patel  Procedure(s) Performed: Procedure(s): ROBOTIC ASSISTED TOTAL HYSTERECTOMY WITH LEFT OOPORECTOMY (N/A) BILATERAL SALPINGECTOMY (Bilateral)  Patient Location: [facility]  Anesthesia Type:General  Level of Consciousness: awake, alert  and oriented  Airway and Oxygen Therapy: Patient Spontanous Breathing and Patient connected to nasal cannula oxygen  Post-op Pain: mild  Post-op Assessment: Post-op Vital signs reviewed and Patient's Cardiovascular Status Stable  Post-op Vital Signs: Reviewed and stable  Complications: No apparent anesthesia complications

## 2013-05-17 NOTE — Progress Notes (Signed)
1 Day Post-Op Procedure(s) (LRB): ROBOTIC ASSISTED TOTAL HYSTERECTOMY WITH LEFT OOPORECTOMY (N/A) BILATERAL SALPINGECTOMY (Bilateral)  Subjective: Patient reports nausea, incisional pain, tolerating PO, + flatus and no problems voiding.    Objective: I have reviewed patient's vital signs, intake and output, medications and labs. BP 136/85  Pulse 78  Temp(Src) 97.9 F (36.6 C) (Oral)  Resp 20  Ht 5' 4.75" (1.645 m)  Wt 83.915 kg (185 lb)  BMI 31.01 kg/m2  SpO2 100%  CBC    Component Value Date/Time   WBC 12.3* 05/17/2013 0530   RBC 3.80* 05/17/2013 0530   HGB 9.3* 05/17/2013 0530   HCT 29.4* 05/17/2013 0530   PLT 181 05/17/2013 0530   MCV 77.4* 05/17/2013 0530   MCH 24.5* 05/17/2013 0530   MCHC 31.6 05/17/2013 0530   RDW 16.4* 05/17/2013 0530   LYMPHSABS 1.1 10/01/2011 0150   MONOABS 0.6 10/01/2011 0150   EOSABS 0.0 10/01/2011 0150   BASOSABS 0.0 10/01/2011 0150      General: alert, cooperative and appears stated age Resp: clear to auscultation bilaterally and normal percussion bilaterally Cardio: regular rate and rhythm, S1, S2 normal, no murmur, click, rub or gallop and normal apical impulse GI: soft, non-tender; bowel sounds normal; no masses,  no organomegaly and incision: clean, dry and intact Extremities: extremities normal, atraumatic, no cyanosis or edema and Homans sign is negative, no sign of DVT Vaginal Bleeding: minimal  Assessment: s/p Procedure(s): ROBOTIC ASSISTED TOTAL HYSTERECTOMY WITH LEFT OOPORECTOMY (N/A) BILATERAL SALPINGECTOMY (Bilateral): stable, progressing well and tolerating diet  Plan: Advance diet Encourage ambulation Advance to PO medication Discontinue IV fluids Discharge home  LOS: 1 day    Leith Hedlund J 05/17/2013, 7:05 AM

## 2013-07-12 ENCOUNTER — Other Ambulatory Visit: Payer: Self-pay

## 2013-07-12 DIAGNOSIS — Z1231 Encounter for screening mammogram for malignant neoplasm of breast: Secondary | ICD-10-CM

## 2013-08-15 ENCOUNTER — Encounter (HOSPITAL_COMMUNITY): Payer: Self-pay | Admitting: Emergency Medicine

## 2013-08-15 ENCOUNTER — Emergency Department (HOSPITAL_COMMUNITY): Payer: 59

## 2013-08-15 ENCOUNTER — Emergency Department (HOSPITAL_COMMUNITY)
Admission: EM | Admit: 2013-08-15 | Discharge: 2013-08-15 | Disposition: A | Discharge status: Home / Self Care | Payer: 59 | Attending: Emergency Medicine | Admitting: Emergency Medicine

## 2013-08-15 DIAGNOSIS — K219 Gastro-esophageal reflux disease without esophagitis: Secondary | ICD-10-CM | POA: Insufficient documentation

## 2013-08-15 DIAGNOSIS — Z79899 Other long term (current) drug therapy: Secondary | ICD-10-CM | POA: Insufficient documentation

## 2013-08-15 DIAGNOSIS — Z862 Personal history of diseases of the blood and blood-forming organs and certain disorders involving the immune mechanism: Secondary | ICD-10-CM | POA: Insufficient documentation

## 2013-08-15 DIAGNOSIS — R0789 Other chest pain: Secondary | ICD-10-CM | POA: Insufficient documentation

## 2013-08-15 DIAGNOSIS — Z90710 Acquired absence of both cervix and uterus: Secondary | ICD-10-CM | POA: Insufficient documentation

## 2013-08-15 DIAGNOSIS — I10 Essential (primary) hypertension: Secondary | ICD-10-CM | POA: Insufficient documentation

## 2013-08-15 DIAGNOSIS — R51 Headache: Secondary | ICD-10-CM | POA: Insufficient documentation

## 2013-08-15 LAB — BASIC METABOLIC PANEL
BUN: 10 mg/dL (ref 6–23)
CALCIUM: 9.1 mg/dL (ref 8.4–10.5)
CO2: 26 mEq/L (ref 19–32)
CREATININE: 0.75 mg/dL (ref 0.50–1.10)
Chloride: 103 mEq/L (ref 96–112)
GFR calc non Af Amer: >90 mL/min (ref >90)
GLUCOSE: 102 mg/dL — AB (ref 70–99)
Potassium: 4.1 mEq/L (ref 3.7–5.3)
Sodium: 139 mEq/L (ref 137–147)

## 2013-08-15 LAB — CBC WITH DIFFERENTIAL/PLATELET
Basophils Absolute: 0 10*3/uL (ref 0.0–0.1)
Basophils Relative: 0 % (ref 0–1)
EOS PCT: 1 % (ref 0–5)
Eosinophils Absolute: 0.1 10*3/uL (ref 0.0–0.7)
HCT: 34 % — ABNORMAL LOW (ref 36.0–46.0)
HEMOGLOBIN: 10.7 g/dL — AB (ref 12.0–15.0)
LYMPHS ABS: 1.3 10*3/uL (ref 0.7–4.0)
Lymphocytes Relative: 14 % (ref 12–46)
MCH: 26.2 pg (ref 26.0–34.0)
MCHC: 31.5 g/dL (ref 30.0–36.0)
MCV: 83.1 fL (ref 78.0–100.0)
MONOS PCT: 9 % (ref 3–12)
Monocytes Absolute: 0.8 10*3/uL (ref 0.1–1.0)
Neutro Abs: 7.4 10*3/uL (ref 1.7–7.7)
Neutrophils Relative %: 76 % (ref 43–77)
Platelets: 218 10*3/uL (ref 150–400)
RBC: 4.09 MIL/uL (ref 3.87–5.11)
RDW: 15.2 % (ref 11.5–15.5)
WBC: 9.7 10*3/uL (ref 4.0–10.5)

## 2013-08-15 LAB — POCT I-STAT TROPONIN I
Troponin i, poc: 0 ng/mL (ref 0.00–0.08)
Troponin i, poc: 0.01 ng/mL (ref 0.00–0.08)

## 2013-08-15 MED ORDER — NITROGLYCERIN 2 % TD OINT
1.0000 [in_us] | TOPICAL_OINTMENT | Freq: Once | TRANSDERMAL | Status: AC
  Administered 2013-08-15: 1 [in_us] via TOPICAL
  Filled 2013-08-15: qty 30

## 2013-08-15 MED ORDER — ASPIRIN 81 MG PO CHEW
324.0000 mg | CHEWABLE_TABLET | Freq: Once | ORAL | Status: AC
  Administered 2013-08-15: 324 mg via ORAL
  Filled 2013-08-15: qty 4

## 2013-08-15 NOTE — ED Provider Notes (Signed)
CSN: [id number]     Arrival date & time 08/15/13  0539 History   First MD Initiated Contact with Patient 08/15/13 0557     Chief Complaint  Patient presents with  . Chest Pain  . Hypertension   (Consider location/radiation/quality/duration/timing/severity/associated sxs/prior Treatment) HPI This a 43 year old female with history of anemia and hypertension who presents with headache and chest pain. Patient reports that over the last week she has had high blood pressures ranging 180s over 100s. She takes carvedilol, HCTZ and is supposed to be taking Losartan as well as she does not think that this helps.  Patient was started on clonidine on Tuesday by her primary care physician. Patient reports persistent high blood pressures. She also reports occipital headache. She denies any vision changes, weakness, numbness or tingling. Patient reports onset of chest tightness last night. Chest tightness was nonradiating. She states that it comes and goes and last only seconds. There is no exertional component.  She reports that it feels "like a twinge." It is currently 3/10. She denies history of hyperlipidemia or smoking. She does have an early family history of heart disease.  Past Medical History  Diagnosis Date  . Anemia     history  . GERD (gastroesophageal reflux disease)   . Hypertension   . S/P Robotic hysterectomy, bilaterla salpingectomy, left oophorectomy (10/6) 05/17/2013   Past Surgical History  Procedure Laterality Date  . Tubal ligation      essure tubal  . Cesarean section    . Wisdom tooth extraction    . Endometrial ablation  2013  . Robotic assisted total hysterectomy N/A 05/16/2013    Procedure: ROBOTIC ASSISTED TOTAL HYSTERECTOMY WITH LEFT OOPORECTOMY;  Surgeon: Lovenia Kim, MD;  Location: [facility];  Service: Gynecology;  Laterality: N/A;  . Bilateral salpingectomy Bilateral 05/16/2013    Procedure: BILATERAL SALPINGECTOMY;  Surgeon: Lovenia Kim, MD;  Location: [facility];   Service: Gynecology;  Laterality: Bilateral;   Family History  Problem Relation Age of Onset  . Hypertension Mother   . Stroke Father    History  Substance Use Topics  . Smoking status: Never Smoker   . Smokeless tobacco: Never Used  . Alcohol Use: Yes     Comment: occasional wine   OB History   Grav Para Term Preterm Abortions TAB SAB Ect Mult Living                 Review of Systems  Constitutional: Negative for fever.  Respiratory: Positive for chest tightness. Negative for cough and shortness of breath.   Cardiovascular: Negative for chest pain.  Gastrointestinal: Negative for nausea, vomiting and abdominal pain.  Genitourinary: Negative for dysuria.  Musculoskeletal: Negative for back pain.  Skin: Negative for wound.  Neurological: Positive for headaches. Negative for dizziness, weakness and numbness.  Psychiatric/Behavioral: Negative for confusion.  All other systems reviewed and are negative.    Allergies  Codeine and Sulfa drugs cross reactors  Home Medications   Current Outpatient Rx  Name  Route  Sig  Dispense  Refill  . azithromycin (ZITHROMAX Z-PAK) 250 MG tablet   Oral   Take 250 mg by mouth daily. zpac   Upper respiratory infection         . carvedilol (COREG) 12.5 MG tablet   Oral   Take 12.5 mg by mouth 2 (two) times daily with a meal.         . cloNIDine (CATAPRES) 0.1 MG tablet   Oral   Take  0.1 mg by mouth 4 (four) times daily.         . hydrochlorothiazide (MICROZIDE) 12.5 MG capsule   Oral   Take 12.5 mg by mouth daily.         Marland Kitchen loratadine (CLARITIN) 10 MG tablet   Oral   Take 10 mg by mouth daily.         Marland Kitchen losartan (COZAAR) 25 MG tablet   Oral   Take 25 mg by mouth daily.         . Multiple Vitamins-Minerals (MULTIVITAMIN WITH MINERALS) tablet   Oral   Take 1 tablet by mouth daily.         Marland Kitchen omeprazole (PRILOSEC OTC) 20 MG tablet   Oral   Take 20 mg by mouth daily.           . traMADol (ULTRAM) 50 MG  tablet   Oral   Take 1-2 tablets (50-100 mg total) by mouth every 6 (six) hours as needed.   30 tablet   0    BP 184/119  Pulse 62  Temp(Src) 98.8 F (37.1 C) (Oral)  Resp 18  Ht 5' 5"  (1.651 m)  Wt 187 lb (84.823 kg)  BMI 31.12 kg/m2  SpO2 100%  LMP 04/30/2013 Physical Exam  Nursing note and vitals reviewed. Constitutional: She is oriented to person, place, and time. She appears well-developed and well-nourished.  HENT:  Head: Normocephalic and atraumatic.  Eyes: Pupils are equal, round, and reactive to light.  Neck: Neck supple.  Cardiovascular: Normal rate, regular rhythm and normal heart sounds.   Pulmonary/Chest: Effort normal. No respiratory distress. She has no wheezes.  Abdominal: Soft. Bowel sounds are normal.  Neurological: She is alert and oriented to person, place, and time.  Skin: Skin is warm and dry.  Psychiatric: She has a normal mood and affect.    ED Course  Procedures (including critical care time) Labs Review Labs Reviewed  CBC WITH DIFFERENTIAL - Abnormal; Notable for the following:    Hemoglobin 10.7 (*)    HCT 34.0 (*)    All other components within normal limits  BASIC METABOLIC PANEL - Abnormal; Notable for the following:    Glucose, Bld 102 (*)    All other components within normal limits  POCT I-STAT TROPONIN I   Imaging Review Dg Chest 2 View  08/15/2013   CLINICAL DATA:  New onset chest pain, with headache and elevated blood pressure.  EXAM: CHEST  2 VIEW  COMPARISON:  Chest radiograph Dec 29, 2012  FINDINGS: Cardiomediastinal silhouette is unremarkable. The lungs are clear without pleural effusions or focal consolidations. Pulmonary vasculature is unremarkable. Trachea projects midline and there is no pneumothorax. Soft tissue planes and included osseous structures are nonsuspicious. Multiple EKG lines overlie the patient and may obscure subtle underlying pathology.  IMPRESSION: No acute cardiopulmonary process; normal chest radiograph.    Electronically Signed   By: Elon Alas   On: 08/15/2013 06:48    EKG Interpretation    Date/Time:  Monday August 15 2013 05:48:35 EST Ventricular Rate:  62 PR Interval:  150 QRS Duration: 78 QT Interval:  416 QTC Calculation: 422 R Axis:   25 Text Interpretation:  Sinus rhythm No significant change since last tracing Confirmed by Rocio Roam  MD, Estuardo Frisbee ([id number]) on 08/15/2013 5:56:49 AM            MDM  No diagnosis found.   Patient presents with headache and chest pain in the setting of hypertension. She  is nontoxic-appearing on exam. Initial blood pressure 184/119. Patient has not taken her morning blood pressure medications. She took carvedilol, losartan, and HCTZ. Patient was given full dose aspirin and nitroglycerin paste was applied. Patient's description of chest pain is somewhat atypical with very short duration and non-exertional. Risk factors include hypertension and family history.  Basic labwork was obtained. Screening EKG is normal and reassuring.  Chest x-ray is unremarkable. Initial troponin is negative.  7:40 AM On reexamination, patient is pain-free. Blood pressure is now 145/95. Patient reports marked improvement of symptoms. She is relatively low-risk for ACS and patient symptoms may been secondary to blood pressure that is now controlled. However, patient does need formal evaluation for ACS with referral to cardiology. I discussed this with the patient. We'll have the patient stay for a delta troponin and to ensure that she remains pain-free and her blood pressure remains stable.  Signed out to Dr. Regenia Skeeter.    Merryl Hacker, MD 08/15/13 [phone]

## 2013-08-15 NOTE — Discharge Instructions (Signed)
You are having a headache. No specific cause was found today for your headache. It may have been a migraine or other cause of headache. Stress, anxiety, fatigue, and depression are common triggers for headaches. Your headache today does not appear to be life-threatening or require hospitalization, but often the exact cause of headaches is not determined in the emergency department. Therefore, follow-up with your doctor is very important to find out what may have caused your headache, and whether or not you need any further diagnostic testing or treatment. Sometimes headaches can appear benign (not harmful), but then more serious symptoms can develop which should prompt an immediate re-evaluation by your doctor or the emergency department. SEEK MEDICAL ATTENTION IF: You develop possible problems with medications prescribed.  The medications don't resolve your headache, if it recurs , or if you have multiple episodes of vomiting or can't take fluids. You have a change from the usual headache. RETURN IMMEDIATELY IF you develop a sudden, severe headache or confusion, become poorly responsive or faint, develop a fever above 100.52F or problem breathing, have a change in speech, vision, swallowing, or understanding, or develop new weakness, numbness, tingling, incoordination, or have a seizure.   Your caregiver has diagnosed you as having chest pain that is not specific for one problem, but does not require admission.  You are at low risk for an acute heart condition or other serious illness. Chest pain comes from many different causes.  SEEK IMMEDIATE MEDICAL ATTENTION IF: You have severe chest pain, especially if the pain is crushing or pressure-like and spreads to the arms, back, neck, or jaw, or if you have sweating, nausea (feeling sick to your stomach), or shortness of breath. THIS IS AN EMERGENCY. Don't wait to see if the pain will go away. Get medical help at once. Call 911 or 0 (operator). DO NOT drive  yourself to the hospital.  Your chest pain gets worse and does not go away with rest.  You have an attack of chest pain lasting longer than usual, despite rest and treatment with the medications your caregiver has prescribed.  You wake from sleep with chest pain or shortness of breath.  You feel dizzy or faint.  You have chest pain not typical of your usual pain for which you originally saw your caregiver.    Chest Pain (Nonspecific) It is often hard to give a specific diagnosis for the cause of chest pain. There is always a chance that your pain could be related to something serious, such as a heart attack or a blood clot in the lungs. You need to follow up with your caregiver for further evaluation. CAUSES   Heartburn.  Pneumonia or bronchitis.  Anxiety or stress.  Inflammation around your heart (pericarditis) or lung (pleuritis or pleurisy).  A blood clot in the lung.  A collapsed lung (pneumothorax). It can develop suddenly on its own (spontaneous pneumothorax) or from injury (trauma) to the chest.  Shingles infection (herpes zoster virus). The chest wall is composed of bones, muscles, and cartilage. Any of these can be the source of the pain.  The bones can be bruised by injury.  The muscles or cartilage can be strained by coughing or overwork.  The cartilage can be affected by inflammation and become sore (costochondritis). DIAGNOSIS  Lab tests or other studies, such as X-rays, electrocardiography, stress testing, or cardiac imaging, may be needed to find the cause of your pain.  TREATMENT   Treatment depends on what may be causing your  chest pain. Treatment may include:  Acid blockers for heartburn.  Anti-inflammatory medicine.  Pain medicine for inflammatory conditions.  Antibiotics if an infection is present.  You may be advised to change lifestyle habits. This includes stopping smoking and avoiding alcohol, caffeine, and chocolate.  You may be advised to keep  your head raised (elevated) when sleeping. This reduces the chance of acid going backward from your stomach into your esophagus.  Most of the time, nonspecific chest pain will improve within 2 to 3 days with rest and mild pain medicine. HOME CARE INSTRUCTIONS   If antibiotics were prescribed, take your antibiotics as directed. Finish them even if you start to feel better.  For the next few days, avoid physical activities that bring on chest pain. Continue physical activities as directed.  Do not smoke.  Avoid drinking alcohol.  Only take over-the-counter or prescription medicine for pain, discomfort, or fever as directed by your caregiver.  Follow your caregiver's suggestions for further testing if your chest pain does not go away.  Keep any follow-up appointments you made. If you do not go to an appointment, you could develop lasting (chronic) problems with pain. If there is any problem keeping an appointment, you must call to reschedule. SEEK MEDICAL CARE IF:   You think you are having problems from the medicine you are taking. Read your medicine instructions carefully.  Your chest pain does not go away, even after treatment.  You develop a rash with blisters on your chest. SEEK IMMEDIATE MEDICAL CARE IF:   You have increased chest pain or pain that spreads to your arm, neck, jaw, back, or abdomen.  You develop shortness of breath, an increasing cough, or you are coughing up blood.  You have severe back or abdominal pain, feel nauseous, or vomit.  You develop severe weakness, fainting, or chills.  You have a fever. THIS IS AN EMERGENCY. Do not wait to see if the pain will go away. Get medical help at once. Call your local emergency services (911 in [place] Do not drive yourself to the hospital. MAKE SURE YOU:   Understand these instructions.  Will watch your condition.  Will get help right away if you are not doing well or get worse. Document Released: 05/07/2005 Document  Revised: 10/20/2011 Document Reviewed: 03/02/2008 [facility] Patient Information 2014 [place].

## 2013-08-15 NOTE — ED Provider Notes (Signed)
8:15 AM Care transferred to me. Patient with HTN and very atypical chest pain. Waiting on 2nd troponin. If neg can f/u as outpatient for cardiac w/u.  9:13 AM 2nd troponin negative. Patient has mild headache, but otherwise no symptoms. Discussed she needs to f/u with PCP for better HTN control, as well as return precautions.  Ephraim Hamburger, MD 08/15/13 [phone]

## 2013-08-15 NOTE — ED Notes (Signed)
Patient is alert and oriented x3.  She is complaining of chest pain, hypertension and a headache. She contacted her primary and was told to double up on her clonidine with no results.

## 2013-08-18 ENCOUNTER — Ambulatory Visit
Admission: RE | Admit: 2013-08-18 | Discharge: 2013-08-18 | Disposition: A | Discharge status: Home / Self Care | Payer: 59 | Source: Ambulatory Visit

## 2013-08-18 ENCOUNTER — Other Ambulatory Visit: Payer: Self-pay | Admitting: Internal Medicine

## 2013-08-18 DIAGNOSIS — I10 Essential (primary) hypertension: Secondary | ICD-10-CM

## 2013-08-18 DIAGNOSIS — Z1231 Encounter for screening mammogram for malignant neoplasm of breast: Secondary | ICD-10-CM

## 2013-08-22 ENCOUNTER — Ambulatory Visit
Admission: RE | Admit: 2013-08-22 | Discharge: 2013-08-22 | Disposition: A | Discharge status: Home / Self Care | Payer: 59 | Source: Ambulatory Visit | Attending: Internal Medicine | Admitting: Internal Medicine

## 2013-08-22 DIAGNOSIS — I10 Essential (primary) hypertension: Secondary | ICD-10-CM

## 2013-08-22 MED ORDER — GADOBENATE DIMEGLUMINE 529 MG/ML IV SOLN: 17.0000 mL | Freq: Once | INTRAVENOUS | Status: AC | PRN

## 2013-08-29 ENCOUNTER — Emergency Department (HOSPITAL_COMMUNITY)
Admission: EM | Admit: 2013-08-29 | Discharge: 2013-08-30 | Disposition: A | Discharge status: Home / Self Care | Payer: 59 | Attending: Emergency Medicine | Admitting: Emergency Medicine

## 2013-08-29 ENCOUNTER — Encounter (HOSPITAL_COMMUNITY): Payer: Self-pay | Admitting: Emergency Medicine

## 2013-08-29 DIAGNOSIS — Z79899 Other long term (current) drug therapy: Secondary | ICD-10-CM | POA: Insufficient documentation

## 2013-08-29 DIAGNOSIS — I10 Essential (primary) hypertension: Secondary | ICD-10-CM | POA: Insufficient documentation

## 2013-08-29 DIAGNOSIS — R079 Chest pain, unspecified: Secondary | ICD-10-CM

## 2013-08-29 DIAGNOSIS — R0789 Other chest pain: Secondary | ICD-10-CM | POA: Insufficient documentation

## 2013-08-29 DIAGNOSIS — Z862 Personal history of diseases of the blood and blood-forming organs and certain disorders involving the immune mechanism: Secondary | ICD-10-CM | POA: Insufficient documentation

## 2013-08-29 DIAGNOSIS — K219 Gastro-esophageal reflux disease without esophagitis: Secondary | ICD-10-CM | POA: Insufficient documentation

## 2013-08-29 DIAGNOSIS — IMO0002 Reserved for concepts with insufficient information to code with codable children: Secondary | ICD-10-CM | POA: Insufficient documentation

## 2013-08-29 DIAGNOSIS — Z90710 Acquired absence of both cervix and uterus: Secondary | ICD-10-CM | POA: Insufficient documentation

## 2013-08-29 NOTE — ED Notes (Signed)
Patient started to have pressure and tightness in her chest 2 days ago. She states it comes and goes. She experienced periods of being hot and cold, nausea, and diaphoresis. She also states she had tightness in her neck and shoulder on the left side during this episode. Tonight was the worst episode she has had. She states this has occurred previously but not as bad. She has being seeing her primary care about her blood pressure and discomfort in her chest.

## 2013-08-30 LAB — CBC
HCT: 35.9 % — ABNORMAL LOW (ref 36.0–46.0)
Hemoglobin: 11.7 g/dL — ABNORMAL LOW (ref 12.0–15.0)
MCH: 26.7 pg (ref 26.0–34.0)
MCHC: 32.6 g/dL (ref 30.0–36.0)
MCV: 82 fL (ref 78.0–100.0)
PLATELETS: 203 10*3/uL (ref 150–400)
RBC: 4.38 MIL/uL (ref 3.87–5.11)
RDW: 14.1 % (ref 11.5–15.5)
WBC: 8.1 10*3/uL (ref 4.0–10.5)

## 2013-08-30 LAB — BASIC METABOLIC PANEL
BUN: 15 mg/dL (ref 6–23)
CALCIUM: 9.6 mg/dL (ref 8.4–10.5)
CO2: 25 meq/L (ref 19–32)
CREATININE: 0.77 mg/dL (ref 0.50–1.10)
Chloride: 98 mEq/L (ref 96–112)
GFR calc Af Amer: >90 mL/min (ref >90)
GFR calc non Af Amer: >90 mL/min (ref >90)
GLUCOSE: 98 mg/dL (ref 70–99)
Potassium: 3.4 mEq/L — ABNORMAL LOW (ref 3.7–5.3)
Sodium: 136 mEq/L — ABNORMAL LOW (ref 137–147)

## 2013-08-30 LAB — POCT I-STAT TROPONIN I: TROPONIN I, POC: 0.02 ng/mL (ref 0.00–0.08)

## 2013-08-30 LAB — TROPONIN I: Troponin I: <0.3 ng/mL (ref <0.30)

## 2013-08-30 MED ORDER — NITROGLYCERIN 0.4 MG SL SUBL
0.4000 mg | SUBLINGUAL_TABLET | SUBLINGUAL | Status: DC | PRN
  Administered 2013-08-30: 0.4 mg via SUBLINGUAL
  Filled 2013-08-30: qty 25

## 2013-08-30 MED ORDER — ASPIRIN EC 325 MG PO TBEC: 325.0000 mg | DELAYED_RELEASE_TABLET | Freq: Every day | ORAL | Status: DC

## 2013-08-30 NOTE — Discharge Instructions (Signed)
We saw you in the ER for the chest pain. All of our cardiac workup is normal, including labs, EKG and chest X-RAY are normal. We are not sure what is causing your discomfort, but we feel comfortable sending you home at this time. The workup in the ER is not complete, and you should follow up with your Primary care doctor, or Cardiologist as requested.  RETURN TO THE ER IF THE SYMPTOMS ARE GETTING WORSE.    Chest Pain (Nonspecific) It is often hard to give a specific diagnosis for the cause of chest pain. There is always a chance that your pain could be related to something serious, such as a heart attack or a blood clot in the lungs. You need to follow up with your caregiver for further evaluation. CAUSES   Heartburn.  Pneumonia or bronchitis.  Anxiety or stress.  Inflammation around your heart (pericarditis) or lung (pleuritis or pleurisy).  A blood clot in the lung.  A collapsed lung (pneumothorax). It can develop suddenly on its own (spontaneous pneumothorax) or from injury (trauma) to the chest.  Shingles infection (herpes zoster virus). The chest wall is composed of bones, muscles, and cartilage. Any of these can be the source of the pain.  The bones can be bruised by injury.  The muscles or cartilage can be strained by coughing or overwork.  The cartilage can be affected by inflammation and become sore (costochondritis). DIAGNOSIS  Lab tests or other studies, such as X-rays, electrocardiography, stress testing, or cardiac imaging, may be needed to find the cause of your pain.  TREATMENT   Treatment depends on what may be causing your chest pain. Treatment may include:  Acid blockers for heartburn.  Anti-inflammatory medicine.  Pain medicine for inflammatory conditions.  Antibiotics if an infection is present.  You may be advised to change lifestyle habits. This includes stopping smoking and avoiding alcohol, caffeine, and chocolate.  You may be advised to keep  your head raised (elevated) when sleeping. This reduces the chance of acid going backward from your stomach into your esophagus.  Most of the time, nonspecific chest pain will improve within 2 to 3 days with rest and mild pain medicine. HOME CARE INSTRUCTIONS   If antibiotics were prescribed, take your antibiotics as directed. Finish them even if you start to feel better.  For the next few days, avoid physical activities that bring on chest pain. Continue physical activities as directed.  Do not smoke.  Avoid drinking alcohol.  Only take over-the-counter or prescription medicine for pain, discomfort, or fever as directed by your caregiver.  Follow your caregiver's suggestions for further testing if your chest pain does not go away.  Keep any follow-up appointments you made. If you do not go to an appointment, you could develop lasting (chronic) problems with pain. If there is any problem keeping an appointment, you must call to reschedule. SEEK MEDICAL CARE IF:   You think you are having problems from the medicine you are taking. Read your medicine instructions carefully.  Your chest pain does not go away, even after treatment.  You develop a rash with blisters on your chest. SEEK IMMEDIATE MEDICAL CARE IF:   You have increased chest pain or pain that spreads to your arm, neck, jaw, back, or abdomen.  You develop shortness of breath, an increasing cough, or you are coughing up blood.  You have severe back or abdominal pain, feel nauseous, or vomit.  You develop severe weakness, fainting, or chills.  You  have a fever. THIS IS AN EMERGENCY. Do not wait to see if the pain will go away. Get medical help at once. Call your local emergency services (911 in [place] Do not drive yourself to the hospital. MAKE SURE YOU:   Understand these instructions.  Will watch your condition.  Will get help right away if you are not doing well or get worse. Document Released: 05/07/2005 Document  Revised: 10/20/2011 Document Reviewed: 03/02/2008 [facility] Patient Information 2014 [place].

## 2013-08-30 NOTE — ED Provider Notes (Signed)
CSN: [id number]     Arrival date & time 08/29/13  2303 History   First MD Initiated Contact with Patient 08/29/13 2313     Chief Complaint  Patient presents with  . Chest Pain   (Consider location/radiation/quality/duration/timing/severity/associated sxs/prior Treatment) HPI Comments: Pt comes in with cc of chest pain. Hx of HTN. Pt's chest pain is left sided, and described as pressure and tightness. Pt describes the chest pain as sharp, pressure and tightness type, and it has been present intermittently over the past several days. Pain is not exertional - she went to the gym just today and was fine, and has no specific provoking factor. Pt's pain moves to the left neck, and scapular region. She has no associated nausea, dib - but does feel HOT/COLD. She has had same pain for a while now, seen in the ED with 2 trops neg, and f.u requested - she saw her PCP, no provocative testing ordered.  Patient is a 43 y.o. female presenting with chest pain. The history is provided by the patient.  Chest Pain Associated symptoms: no abdominal pain, no cough, no headache, no nausea, no shortness of breath and not vomiting     Past Medical History  Diagnosis Date  . Anemia     history  . GERD (gastroesophageal reflux disease)   . Hypertension   . S/P Robotic hysterectomy, bilaterla salpingectomy, left oophorectomy (10/6) 05/17/2013   Past Surgical History  Procedure Laterality Date  . Tubal ligation      essure tubal  . Cesarean section    . Wisdom tooth extraction    . Endometrial ablation  2013  . Robotic assisted total hysterectomy N/A 05/16/2013    Procedure: ROBOTIC ASSISTED TOTAL HYSTERECTOMY WITH LEFT OOPORECTOMY;  Surgeon: Lovenia Kim, MD;  Location: [facility];  Service: Gynecology;  Laterality: N/A;  . Bilateral salpingectomy Bilateral 05/16/2013    Procedure: BILATERAL SALPINGECTOMY;  Surgeon: Lovenia Kim, MD;  Location: [facility];  Service: Gynecology;  Laterality: Bilateral;   Family  History  Problem Relation Age of Onset  . Hypertension Mother   . Stroke Father    History  Substance Use Topics  . Smoking status: Never Smoker   . Smokeless tobacco: Never Used  . Alcohol Use: Yes     Comment: occasional wine   OB History   Grav Para Term Preterm Abortions TAB SAB Ect Mult Living                 Review of Systems  Constitutional: Positive for activity change.  Respiratory: Negative for cough and shortness of breath.   Cardiovascular: Positive for chest pain.  Gastrointestinal: Negative for nausea, vomiting and abdominal pain.  Genitourinary: Negative for dysuria.  Musculoskeletal: Negative for neck pain.  Neurological: Negative for headaches.    Allergies  Celebrex; Clonidine derivatives; and Sulfa drugs cross reactors  Home Medications   Current Outpatient Rx  Name  Route  Sig  Dispense  Refill  . amLODipine (NORVASC) 5 MG tablet   Oral   Take 5 mg by mouth daily.         . carvedilol (COREG) 12.5 MG tablet   Oral   Take 12.5 mg by mouth 2 (two) times daily with a meal.         . fluticasone (FLONASE) 50 MCG/ACT nasal spray   Each Nare   Place 1 spray into both nostrils daily.         . hydrochlorothiazide (HYDRODIURIL) 25 MG tablet  Oral   Take 25 mg by mouth daily.         Marland Kitchen loratadine (CLARITIN) 10 MG tablet   Oral   Take 10 mg by mouth daily.         Marland Kitchen losartan (COZAAR) 25 MG tablet   Oral   Take 25 mg by mouth daily.         . montelukast (SINGULAIR) 10 MG tablet   Oral   Take 10 mg by mouth daily.         . Multiple Vitamins-Minerals (MULTIVITAMIN WITH MINERALS) tablet   Oral   Take 1 tablet by mouth daily.         Marland Kitchen omeprazole (PRILOSEC OTC) 20 MG tablet   Oral   Take 20 mg by mouth daily.            BP 118/79  Pulse 75  Temp(Src) 98.3 F (36.8 C) (Oral)  Resp 22  Ht 5' 5"  (1.651 m)  Wt 175 lb (79.379 kg)  BMI 29.12 kg/m2  SpO2 97%  LMP 04/30/2013 Physical Exam  Nursing note and vitals  reviewed. Constitutional: She is oriented to person, place, and time. She appears well-developed and well-nourished.  HENT:  Head: Normocephalic and atraumatic.  Eyes: EOM are normal. Pupils are equal, round, and reactive to light.  Neck: Neck supple.  Cardiovascular: Normal rate, regular rhythm and normal heart sounds.   No murmur heard. Pulmonary/Chest: Effort normal. No respiratory distress.  Abdominal: Soft. She exhibits no distension. There is no tenderness. There is no rebound and no guarding.  Neurological: She is alert and oriented to person, place, and time.  Skin: Skin is warm and dry.    ED Course  Procedures (including critical care time) Labs Review Labs Reviewed  CBC - Abnormal; Notable for the following:    Hemoglobin 11.7 (*)    HCT 35.9 (*)    All other components within normal limits  BASIC METABOLIC PANEL - Abnormal; Notable for the following:    Sodium 136 (*)    Potassium 3.4 (*)    All other components within normal limits  POCT I-STAT TROPONIN I   Imaging Review No results found.  EKG Interpretation   None       MDM  No diagnosis found.   Date: 08/30/2013  Rate: 87  Rhythm: normal sinus rhythm  QRS Axis: normal  Intervals: normal  ST/T Wave abnormalities: normal  Conduction Disutrbances: none  Narrative Interpretation: unremarkable  Pt comes in with cc of chest pain. Chest pain is atypical, but slight suspicion exist due to left sided nature, with radiation. Pain not exertional, pleuritic. HEART score is: 2 Moderately suspicious 1  EKG Normal 0   Age: ? 46 years 0   Risk Factors  1 or 2 risk factors 1  Troponins ? normal limit 0  Will give cardiology f/u.        Varney Biles, MD 08/30/13 [phone]

## 2013-08-31 ENCOUNTER — Other Ambulatory Visit: Payer: Self-pay | Admitting: *Deleted

## 2013-09-05 ENCOUNTER — Encounter: Payer: Self-pay | Admitting: Cardiology

## 2013-09-05 ENCOUNTER — Ambulatory Visit (INDEPENDENT_AMBULATORY_CARE_PROVIDER_SITE_OTHER): Payer: 59 | Admitting: Cardiology

## 2013-09-05 VITALS — BP 127/81 | HR 107 | Ht 65.0 in | Wt 177.0 lb

## 2013-09-05 DIAGNOSIS — I10 Essential (primary) hypertension: Secondary | ICD-10-CM | POA: Insufficient documentation

## 2013-09-05 DIAGNOSIS — R079 Chest pain, unspecified: Secondary | ICD-10-CM

## 2013-09-05 NOTE — Progress Notes (Signed)
Patient ID: Alicia Patel, female   DOB: 10-28-1970, 43 y.o.   MRN: [id number]     Patient Name: Alicia Patel Date of Encounter: 09/05/2013  Primary Care Provider:  Jani Gravel, MD Primary Cardiologist:  Ena Dawley, H   Problem List   Past Medical History  Diagnosis Date  . Anemia     history  . GERD (gastroesophageal reflux disease)   . Hypertension   . S/P Robotic hysterectomy, bilaterla salpingectomy, left oophorectomy (10/6) 05/17/2013   Past Surgical History  Procedure Laterality Date  . Tubal ligation      essure tubal  . Cesarean section    . Wisdom tooth extraction    . Endometrial ablation  2013  . Robotic assisted total hysterectomy N/A 05/16/2013    Procedure: ROBOTIC ASSISTED TOTAL HYSTERECTOMY WITH LEFT OOPORECTOMY;  Surgeon: Lovenia Kim, MD;  Location: [facility];  Service: Gynecology;  Laterality: N/A;  . Bilateral salpingectomy Bilateral 05/16/2013    Procedure: BILATERAL SALPINGECTOMY;  Surgeon: Lovenia Kim, MD;  Location: [facility];  Service: Gynecology;  Laterality: Bilateral;    Allergies  Allergies  Allergen Reactions  . Carvedilol     Can not have beta Blockers  . Celebrex [Celecoxib] Itching  . Clonidine Derivatives Swelling  . Sulfa Drugs Cross Reactors Itching and Nausea And Vomiting    HPI  This is a very pleasant 43 year old female with history of hypertension and uterine fibroids for sure which she underwent partial hysterectomy in October of last year. The patient states that starting around Thanksgiving she started tosevere headaches associated with sweats flushing, chest pain and left arm pain. This was progressively worsening and it prompted to 2 ER visits. Patient was found to have hypertensive urgency and was tried on several medications including clonidine that didn't help eventually switched to combination of losartan, hydrochlorothiazide, and amlodipine that seems to be controlling her blood pressure well. Since then the  patient hasn't experienced symptoms of flushing headaches and acute spells of nausea and sweating. She still experiences episodes of retrosternal chest pressure and left arm pain. The patient is very active and goes to gym and denies any symptoms such as chest pain and shortness of breath during exercise. Her primary care physician perform extensive workup for secondary causes of hypertension including labs, abdomen MRA that ruled out renal artery stenosis as well as potential adrenal mass. Patient states that she already collected 24-hour urine at her primary care physician office as well. She denies any palpitation or dizziness or prior episode of syncope.   Home Medications  Prior to Admission medications   Medication Sig Start Date End Date Taking? Authorizing Provider  amLODipine (NORVASC) 5 MG tablet Take 5 mg by mouth daily.   Yes Historical Provider, MD  aspirin EC 325 MG tablet Take 1 tablet (325 mg total) by mouth daily. 08/30/13  Yes Ankit Nanavati, MD  EPIPEN 2-PAK 0.3 MG/0.3ML SOAJ injection as directed. 08/27/13  Yes Historical Provider, MD  fluticasone (FLONASE) 50 MCG/ACT nasal spray Place 1 spray into both nostrils daily.   Yes Historical Provider, MD  hydrochlorothiazide (HYDRODIURIL) 25 MG tablet Take 25 mg by mouth daily.   Yes Historical Provider, MD  loratadine (CLARITIN) 10 MG tablet Take 10 mg by mouth daily.   Yes Historical Provider, MD  losartan (COZAAR) 25 MG tablet Take 25 mg by mouth daily.   Yes Historical Provider, MD  montelukast (SINGULAIR) 10 MG tablet Take 10 mg by mouth daily.   Yes Historical  Provider, MD  Multiple Vitamins-Minerals (MULTIVITAMIN WITH MINERALS) tablet Take 1 tablet by mouth daily.   Yes Historical Provider, MD  omeprazole (PRILOSEC OTC) 20 MG tablet Take 20 mg by mouth daily.     Yes Historical Provider, MD   Family History  Family History  Problem Relation Age of Onset  . Hypertension Mother   . Stroke Father     Social  History  History   Social History  . Marital Status: Divorced    Spouse Name: N/A    Number of Children: N/A  . Years of Education: N/A   Occupational History  . Not on file.   Social History Main Topics  . Smoking status: Never Smoker   . Smokeless tobacco: Never Used  . Alcohol Use: Yes     Comment: occasional wine  . Drug Use: No  . Sexual Activity: Yes    Birth Control/ Protection: Surgical   Other Topics Concern  . Not on file   Social History Narrative  . No narrative on file     Review of Systems, as per HPI, otherwise negative General:  No chills, fever, night sweats or weight changes.  Cardiovascular:  No chest pain, dyspnea on exertion, edema, orthopnea, palpitations, paroxysmal nocturnal dyspnea. Dermatological: No rash, lesions/masses Respiratory: No cough, dyspnea Urologic: No hematuria, dysuria Abdominal:   No nausea, vomiting, diarrhea, bright red blood per rectum, melena, or hematemesis Neurologic:  No visual changes, wkns, changes in mental status. All other systems reviewed and are otherwise negative except as noted above.  Physical Exam  Blood pressure 127/81, pulse 107, height 5' 5"  (1.651 m), weight 177 lb (80.287 kg), last menstrual period 04/30/2013.  General: Pleasant, NAD Psych: Normal affect. Neuro: Alert and oriented X 3. Moves all extremities spontaneously. HEENT: Normal  Neck: Supple without bruits or JVD. Lungs:  Resp regular and unlabored, CTA. Heart: RRR no s3, s4, or murmurs. Abdomen: Soft, non-tender, non-distended, BS + x 4.  Extremities: No clubbing, cyanosis or edema. DP/PT/Radials 2+ and equal bilaterally.  Labs:  No results found for this basename: CKTOTAL, CKMB, TROPONINI,  in the last 72 hours Lab Results  Component Value Date   WBC 8.1 08/29/2013   HGB 11.7* 08/29/2013   HCT 35.9* 08/29/2013   MCV 82.0 08/29/2013   PLT 203 08/29/2013    Recent Labs Lab 08/29/13 2330  NA 136*  K 3.4*  CL 98  CO2 25  BUN 15   CREATININE 0.77  CALCIUM 9.6  GLUCOSE 98   Accessory Clinical Findings  Echocardiogram - none  ECG - sinus rhythm, possible old anterolateral infarct, progression in the precordial leads  FINDINGS: MRA ABDOMEN FINDINGS  A single renal arteries are widely patent. Celiac and SMA are widely patent. IMA is grossly patent. Common, internal, and external iliac arteries are all grossly patent.  Tiny lesions in the liver which are hyperintense on T2 and hypointense on T1 are likely cysts.  Gallbladder, kidneys, adrenal glands, spleen, and pancreas are within normal limits.  IMPRESSION:  MRA ABDOMEN IMPRESSION  No evidence of renal artery stenosis.    Assessment & Plan  Number pleasant 43 year old female with accelerated hypertension of unknown etiology. 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 Carcinoid syndrome is still a possibility as the catecholamine excess is usually only detected at the time of acute episode. MRI of the abdomen ruled  out any possible mass. At this point weekly with the blood pressure medication that the patient is taking. We will order echocardiogram to evaluate for degree her heart has been affected by these blood pressure to evaluate for systolic and diastolic LV function as well as for degree of left ventricle hypertrophy. In case this is carcinoid syndrome followup abnormalities, be ruled out however she has no murmur on the physical exam. We will also obtain nuclear exercise stress test to evaluate for ischemia as well as blood pressure response at stress.   We will follow the patient in after those tests are performed.  Dorothy Spark, MD, [facility] 09/05/2013, 4:12 PM

## 2013-09-05 NOTE — Patient Instructions (Signed)
Your physician has requested that you have an echocardiogram. Echocardiography is a painless test that uses sound waves to create images of your heart. It provides your doctor with information about the size and shape of your heart and how well your heart's chambers and valves are working. This procedure takes approximately one hour. There are no restrictions for this procedure.  Your physician has requested that you have en exercise stress myoview. For further information please visit [email or web address]. Please follow instruction sheet, as given.  Your physician recommends that you schedule a follow-up appointment after tests with Dr. Meda Coffee.

## 2013-09-08 ENCOUNTER — Ambulatory Visit (HOSPITAL_COMMUNITY): Payer: 59 | Attending: Cardiology | Admitting: Radiology

## 2013-09-08 ENCOUNTER — Ambulatory Visit (HOSPITAL_BASED_OUTPATIENT_CLINIC_OR_DEPARTMENT_OTHER): Payer: 59 | Admitting: Radiology

## 2013-09-08 VITALS — BP 99/83 | Ht 65.0 in | Wt 177.0 lb

## 2013-09-08 DIAGNOSIS — R079 Chest pain, unspecified: Secondary | ICD-10-CM

## 2013-09-08 DIAGNOSIS — I10 Essential (primary) hypertension: Secondary | ICD-10-CM

## 2013-09-08 MED ORDER — REGADENOSON 0.4 MG/5ML IV SOLN
0.4000 mg | Freq: Once | INTRAVENOUS | Status: AC
  Administered 2013-09-08: 0.4 mg via INTRAVENOUS

## 2013-09-08 MED ORDER — TECHNETIUM TC 99M SESTAMIBI GENERIC - CARDIOLITE
10.0000 | Freq: Once | INTRAVENOUS | Status: AC | PRN
  Administered 2013-09-08: 10 via INTRAVENOUS

## 2013-09-08 MED ORDER — TECHNETIUM TC 99M SESTAMIBI GENERIC - CARDIOLITE
30.0000 | Freq: Once | INTRAVENOUS | Status: AC | PRN
  Administered 2013-09-08: 30 via INTRAVENOUS

## 2013-09-08 NOTE — Progress Notes (Signed)
Echocardiogram performed.  

## 2013-09-08 NOTE — Progress Notes (Signed)
[facility] [address] [place], [place] [address] [phone]    Cardiology Nuclear Med Study  Alicia Patel is a 43 y.o. female     MRN : [id number]     DOB: [birth year]  Procedure Date: 09/08/2013  Nuclear Med Background Indication for Stress Test:  Evaluation for Ischemia and [facility] 1/15 CP, R/O MI History:  No H/O CAD Cardiac Risk Factors: Family History - CAD and Hypertension  Symptoms:  Chest Pain and DOE   Nuclear Pre-Procedure Caffeine/Decaff Intake:  None NPO After: 7 am   Lungs:  clear O2 Sat: 98% on room air. IV 0.9% NS with Angio Cath:  22g  IV Site: L Antecubital  IV Started by:  Crissie Figures, RN  Chest Size (in):  36 Cup Size: C  Height: 5' 5"  (1.651 m)  Weight:  177 lb (80.287 kg)  BMI:  Body mass index is 29.45 kg/(m^2). Tech Comments:  N/A    Nuclear Med Study 1 or 2 day study: 1 day  Stress Test Type:  Lexiscan  Reading MD: N/A  Order Authorizing Provider:  Ena Dawley, MD  Resting Radionuclide: Technetium 17mSestamibi  Resting Radionuclide Dose: 11.0 mCi   Stress Radionuclide:  Technetium 984mestamibi  Stress Radionuclide Dose: 33.0 mCi           Stress Protocol Rest HR: 89 Stress HR: 125  Rest BP: 99/83 Stress BP: 118/82  Exercise Time (min): n/a METS: n/a   Predicted Max HR: 179 bpm % Max HR: 69.83 bpm Rate Pressure Product: 14750   Dose of Adenosine (mg):  n/a Dose of Lexiscan: 0.4 mg  Dose of Atropine (mg): n/a Dose of Dobutamine: n/a mcg/kg/min (at max HR)  Stress Test Technologist: SaPerrin MalteseEMT-P  Nuclear Technologist:  StCharlton AmorCNMT     Rest Procedure:  Myocardial perfusion imaging was performed at rest 45 minutes following the intravenous administration of Technetium 9953mstamibi. Rest ECG: NSR - Normal EKG  Stress Procedure:  The patient received IV Lexiscan 0.4 mg over 15-seconds.  Technetium 64m77mtamibi injected at 30-seconds. This patient felt weird and had a  headache with the Lexiscan injection. Quantitative spect images were obtained after a 45 minute delay. Stress ECG: No significant change from baseline ECG  QPS Raw Data Images:  There is interference from nuclear activity from structures below the diaphragm. This does not affect the ability to read the study. Stress Images:  There is interference due to uptake in structures below the diaphram.    This tends to lower counts in adjacent areas.  There is some attenuation of the inferior apical segments that is most consistent with artifact attenuation.        Rest Images:  There is interference due to uptake in structures below the diaphram.    This tends to lower counts in adjacent areas.  There is a small area of moderate attenuation of the inferior apical segments that is most consistent with artifact attenuation.  Subtraction (SDS):  No evidence of ischemia. Transient Ischemic Dilatation (Normal <1.22):  .93 Lung/Heart Ratio (Normal <0.45):  .22  Quantitative Gated Spect Images QGS EDV:  87 ml QGS ESV:  36 ml  Impression Exercise Capacity:  Lexiscan with no exercise. BP Response:  Normal blood pressure response. Clinical Symptoms:  No significant symptoms noted. ECG Impression:  No significant ST segment change suggestive of ischemia. Comparison with Prior Nuclear Study: No previous nuclear study performed  Overall Impression:  Low risk stress nuclear study   There is some inhomogeneous uptake and artifact due to uptake in structures below the diaphragm.    This area contracts normally and I do think think this represents an area of ischemia or infarct.   The LV wall motion is normal. .  LV Ejection Fraction: 59%.  LV Wall Motion:  NL LV Function; NL Wall Motion.  Visually, the LV function appears to be greater than the 59% that the computer generated.     Thayer Headings, Brooke Bonito., MD, [facility] 09/08/2013, 4:39 PM Office - [phone] Pager [phone]

## 2013-09-14 ENCOUNTER — Telehealth: Payer: Self-pay | Admitting: Cardiology

## 2013-09-14 NOTE — Telephone Encounter (Signed)
**Note De-Identified Alicia Patel Obfuscation** Per the pts request Im leaving this message on her VM. I asked that she call me back to let me know if she wants to f/u with Dr Meda Coffee in 1 year or if she is going to see her PCP for her HTN.

## 2013-09-14 NOTE — Telephone Encounter (Signed)
Yes, both tests were normal and she doesn't need to follow. I would like to see her 1 year from now, but if she prefers to follow with her PCP for hypertension that's ok too.

## 2013-09-14 NOTE — Telephone Encounter (Signed)
°  Patient is returning your call, please call back.

## 2013-09-14 NOTE — Telephone Encounter (Signed)
The pt wants to know, since her Echo and stress test was normal, if she will need top keep her f/u appt that is scheduled for 2/12. Please advise.

## 2013-09-22 ENCOUNTER — Ambulatory Visit: Payer: 59 | Admitting: Cardiology

## 2014-05-26 ENCOUNTER — Other Ambulatory Visit: Payer: Self-pay

## 2014-07-05 ENCOUNTER — Ambulatory Visit (INDEPENDENT_AMBULATORY_CARE_PROVIDER_SITE_OTHER): Payer: 59 | Admitting: Diagnostic Neuroimaging

## 2014-07-05 ENCOUNTER — Encounter: Payer: Self-pay | Admitting: Diagnostic Neuroimaging

## 2014-07-05 VITALS — BP 131/90 | HR 84 | Temp 98.2°F | Ht 65.0 in | Wt 182.0 lb

## 2014-07-05 DIAGNOSIS — G44211 Episodic tension-type headache, intractable: Secondary | ICD-10-CM

## 2014-07-05 DIAGNOSIS — R519 Headache, unspecified: Secondary | ICD-10-CM

## 2014-07-05 DIAGNOSIS — R51 Headache: Secondary | ICD-10-CM

## 2014-07-05 MED ORDER — PROPRANOLOL HCL ER 60 MG PO CP24: 60.0000 mg | ORAL_CAPSULE | Freq: Every day | ORAL | Status: DC

## 2014-07-05 NOTE — Patient Instructions (Signed)
Try propranolol 93m daily; increase to 1258mdaily after 1-2 weeks. Monitor blood pressure at home.  I will check MRI and labs.

## 2014-07-05 NOTE — Progress Notes (Signed)
[facility]  PATIENT: Alicia Patel DOB: 1971-04-20  REFERRING CLINICIAN: Kim HISTORY FROM: patient  REASON FOR VISIT: new consult   HISTORICAL  CHIEF COMPLAINT:  Chief Complaint  Patient presents with  . New Patient    RM 6  . Headache    HISTORY OF PRESENT ILLNESS:   43 year old female with headaches. Frontal headache, right or left, for past 6 weeks. Tension pain, sinus pain. Face hurts with HA. Some shooting pain dwon left face. Feels some swelling under eye, with fever sensation. HA lasts 1 hour up to 2 hours. No nausea/vomiting. No scintillating scotoma. Mild blurred vision. HA are almost every day. No prior similar HA. No pain with chewing. No other triggering factors.   Has been tested for allergies and has multiple allergens. No history of anaphylaxis, but was given epi-pen as precaution.     REVIEW OF SYSTEMS: Full 14 system review of systems performed and notable only for HA facial pain.  ALLERGIES: Allergies  Allergen Reactions  . Celebrex [Celecoxib] Itching  . Clonidine Derivatives Swelling  . Sulfa Drugs Cross Reactors Itching and Nausea And Vomiting    HOME MEDICATIONS: Outpatient Prescriptions Prior to Visit  Medication Sig Dispense Refill  . EPIPEN 2-PAK 0.3 MG/0.3ML SOAJ injection as directed.    . fluticasone (FLONASE) 50 MCG/ACT nasal spray Place 1 spray into both nostrils daily.    . hydrochlorothiazide (HYDRODIURIL) 25 MG tablet Take 25 mg by mouth daily.    Marland Kitchen loratadine (CLARITIN) 10 MG tablet Take 10 mg by mouth daily.    . montelukast (SINGULAIR) 10 MG tablet Take 10 mg by mouth daily.    . Multiple Vitamins-Minerals (MULTIVITAMIN WITH MINERALS) tablet Take 1 tablet by mouth daily.    Marland Kitchen omeprazole (PRILOSEC OTC) 20 MG tablet Take 20 mg by mouth daily.      Marland Kitchen amLODipine (NORVASC) 5 MG tablet Take 5 mg by mouth daily.    Marland Kitchen aspirin EC 325 MG tablet Take 1 tablet (325 mg total) by mouth daily. 30 tablet 0  . losartan  (COZAAR) 25 MG tablet Take 25 mg by mouth daily.     No facility-administered medications prior to visit.    PAST MEDICAL HISTORY: Past Medical History  Diagnosis Date  . Anemia     history  . GERD (gastroesophageal reflux disease)   . Hypertension   . S/P Robotic hysterectomy, bilaterla salpingectomy, left oophorectomy (10/6) 05/17/2013    PAST SURGICAL HISTORY: Past Surgical History  Procedure Laterality Date  . Tubal ligation      essure tubal  . Cesarean section    . Wisdom tooth extraction    . Endometrial ablation  2013  . Robotic assisted total hysterectomy N/A 05/16/2013    Procedure: ROBOTIC ASSISTED TOTAL HYSTERECTOMY WITH LEFT OOPORECTOMY;  Surgeon: Lovenia Kim, MD;  Location: [facility];  Service: Gynecology;  Laterality: N/A;  . Bilateral salpingectomy Bilateral 05/16/2013    Procedure: BILATERAL SALPINGECTOMY;  Surgeon: Lovenia Kim, MD;  Location: [facility];  Service: Gynecology;  Laterality: Bilateral;    FAMILY HISTORY: Family History  Problem Relation Age of Onset  . Hypertension Mother   . Stroke Father   . Breast cancer Mother     SOCIAL HISTORY:  History   Social History  . Marital Status: Divorced    Spouse Name: N/A    Number of Children: 1  . Years of Education: Bachelors   Occupational History  . [employer]  Social History Main Topics  . Smoking status: Never Smoker   . Smokeless tobacco: Never Used  . Alcohol Use: 0.0 oz/week    0 Not specified per week     Comment: occasional wine  . Drug Use: No  . Sexual Activity: Yes    Birth Control/ Protection: Surgical   Other Topics Concern  . Not on file   Social History Narrative   Patient lives alone and works for [employer]   Patient Drinks 1 cup of caffeine daily.   Patient has a Bachelor's degree   Patient is right handed.        PHYSICAL EXAM  Filed Vitals:   07/05/14 0851  BP: 131/90  Pulse: 84  Temp: 98.2 F (36.8 C)  Height: 5' 5"  (1.651 m)    Weight: 182 lb (82.555 kg)    Body mass index is 30.29 kg/(m^2).   Visual Acuity Screening   Right eye Left eye Both eyes  Without correction: 20/30 20/30 20/30   With correction:       No flowsheet data found.  GENERAL EXAM: Patient is in no distress; well developed, nourished and groomed; neck is supple  CARDIOVASCULAR: Regular rate and rhythm, no murmurs, no carotid bruits; MILD TENDERNESS IN LEFT TEMPORAL ARTERY REGION; BILATERAL TEMP ARTERIES PALPABLE  NEUROLOGIC: MENTAL STATUS: awake, alert, oriented to person, place and time, recent and remote memory intact, normal attention and concentration, language fluent, comprehension intact, naming intact, fund of knowledge appropriate CRANIAL NERVE: no papilledema on fundoscopic exam, pupils equal and reactive to light, visual fields full to confrontation, extraocular muscles intact, no nystagmus, facial sensation and strength symmetric, hearing intact, palate elevates symmetrically, uvula midline, shoulder shrug symmetric, tongue midline. MOTOR: normal bulk and tone, full strength in the BUE, BLE SENSORY: normal and symmetric to light touch, pinprick, temperature, vibration COORDINATION: finger-nose-finger, fine finger movements normal REFLEXES: deep tendon reflexes present and symmetric GAIT/STATION: narrow based gait; able to walk tandem; romberg is negative    DIAGNOSTIC DATA (LABS, IMAGING, TESTING) - I reviewed patient records, labs, notes, testing and imaging myself where available.  Lab Results  Component Value Date   WBC 8.1 08/29/2013   HGB 11.7* 08/29/2013   HCT 35.9* 08/29/2013   MCV 82.0 08/29/2013   PLT 203 08/29/2013      Component Value Date/Time   NA 136* 08/29/2013 2330   K 3.4* 08/29/2013 2330   CL 98 08/29/2013 2330   CO2 25 08/29/2013 2330   GLUCOSE 98 08/29/2013 2330   BUN 15 08/29/2013 2330   CREATININE 0.77 08/29/2013 2330   CALCIUM 9.6 08/29/2013 2330   PROT 7.1 05/06/2013 1000   ALBUMIN 3.5  05/06/2013 1000   AST 16 05/06/2013 1000   ALT 12 05/06/2013 1000   ALKPHOS 56 05/06/2013 1000   BILITOT 0.3 05/06/2013 1000   GFRNONAA >90 08/29/2013 2330   GFRAA >90 08/29/2013 2330   No results found for: CHOL, HDL, LDLCALC, LDLDIRECT, TRIG, CHOLHDL No results found for: HGBA1C No results found for: VITAMINB12 No results found for: TSH    ASSESSMENT AND PLAN  43 y.o. year old female here with new onset headache since past 6 weeks, with tension, autonomic and trigeminal features. Will check additional testing. Will try propranolol for headache prevention. Patient has epi-pen but never needed it in the past. Will rx with caution, since beta-blocker could make epi-pen ineffective if she needed it in the future.  Ddx: tension HA, migraine variant, other trigeminal autonomic cephalgia, temporal arteritis,  other secondary headache (tumor, bleeding, inflammation)  PLAN:  Orders Placed This Encounter  Procedures  . MR Brain W Wo Contrast  . MR MRA HEAD WO CONTRAST  . Sedimentation Rate  . C-reactive Protein   Meds ordered this encounter  Medications  . propranolol ER (INDERAL LA) 60 MG 24 hr capsule    Sig: Take 1-2 capsules (60-120 mg total) by mouth daily.    Dispense:  60 capsule    Refill:  3   Return in about 2 weeks (around 07/19/2014).    Penni Bombard, MD 98/33/8250, 5:39 AM Certified in Neurology, Neurophysiology and Neuroimaging  [facility] [address], [place], [place] [address] [phone]

## 2014-07-06 LAB — SEDIMENTATION RATE: Sed Rate: 8 mm/hr (ref 0–32)

## 2014-07-06 LAB — C-REACTIVE PROTEIN: CRP: 2.5 mg/L (ref 0.0–4.9)

## 2014-07-19 ENCOUNTER — Ambulatory Visit: Payer: 59 | Admitting: Diagnostic Neuroimaging

## 2014-07-20 ENCOUNTER — Ambulatory Visit (INDEPENDENT_AMBULATORY_CARE_PROVIDER_SITE_OTHER): Payer: 59

## 2014-07-20 DIAGNOSIS — G44211 Episodic tension-type headache, intractable: Secondary | ICD-10-CM

## 2014-07-20 DIAGNOSIS — R519 Headache, unspecified: Secondary | ICD-10-CM

## 2014-07-20 DIAGNOSIS — R51 Headache: Secondary | ICD-10-CM

## 2014-07-21 MED ORDER — GADOPENTETATE DIMEGLUMINE 469.01 MG/ML IV SOLN: 17.0000 mL | Freq: Once | INTRAVENOUS | Status: AC | PRN

## 2014-07-31 ENCOUNTER — Ambulatory Visit (INDEPENDENT_AMBULATORY_CARE_PROVIDER_SITE_OTHER): Payer: 59 | Admitting: Diagnostic Neuroimaging

## 2014-07-31 ENCOUNTER — Encounter: Payer: Self-pay | Admitting: Diagnostic Neuroimaging

## 2014-07-31 VITALS — BP 140/98 | HR 86 | Ht 65.0 in | Wt 186.0 lb

## 2014-07-31 DIAGNOSIS — G43109 Migraine with aura, not intractable, without status migrainosus: Secondary | ICD-10-CM

## 2014-07-31 MED ORDER — TOPIRAMATE 25 MG PO TABS: 25.0000 mg | ORAL_TABLET | Freq: Two times a day (BID) | ORAL | Status: DC

## 2014-07-31 MED ORDER — SUMATRIPTAN SUCCINATE 100 MG PO TABS: 100.0000 mg | ORAL_TABLET | Freq: Once | ORAL | Status: DC | PRN

## 2014-07-31 MED ORDER — DICLOFENAC POTASSIUM(MIGRAINE) 50 MG PO PACK: 50.0000 mg | PACK | ORAL | Status: DC | PRN

## 2014-07-31 NOTE — Patient Instructions (Signed)
Try topiramate daily.  Try cambia and sumatriptan as needed for migraine.

## 2014-07-31 NOTE — Progress Notes (Signed)
[facility]  PATIENT: Alicia Patel DOB: 02-13-1971  REFERRING CLINICIAN: Kim HISTORY FROM: patient  REASON FOR VISIT: new consult   HISTORICAL  CHIEF COMPLAINT:  Chief Complaint  Patient presents with  . Follow-up    RM 7  . Intractable episodic tension-type headache  . medication review    propranalol, stop taking due to swelling    HISTORY OF PRESENT ILLNESS:   UPDATE 07/31/14: Since last visit, tried propranolol, with benefit in HA (8 per month to 2 per month). However noted more facial, arm and body swelling / fluid retention. Has stopped propranolol x 3 days and swelling is improving. Reports seeing headache/wellness center in 2008, for diff type of headache, tried TPX back then, without benefit. Daughter has migraine. Patient never tried triptans.  PRIOR HPI (07/05/14): 43 year old female with headaches. Frontal headache, right or left, for past 6 weeks. Tension pain, sinus pain. Face hurts with HA. Some shooting pain dwon left face. Feels some swelling under eye, with fever sensation. HA lasts 1 hour up to 2 hours. No nausea/vomiting. No scintillating scotoma. Mild blurred vision. HA are almost every day. No prior similar HA. No pain with chewing. No other triggering factors.  Has been tested for allergies and has multiple allergens. No history of anaphylaxis, but was given epi-pen as precaution.    REVIEW OF SYSTEMS: Full 14 system review of systems performed and notable only for HA facial pain.  ALLERGIES: Allergies  Allergen Reactions  . Celebrex [Celecoxib] Itching  . Clonidine Derivatives Swelling  . Sulfa Drugs Cross Reactors Itching and Nausea And Vomiting    HOME MEDICATIONS: Outpatient Prescriptions Prior to Visit  Medication Sig Dispense Refill  . aspirin 81 MG chewable tablet Chew 81 mg by mouth daily.    Marland Kitchen EPIPEN 2-PAK 0.3 MG/0.3ML SOAJ injection as directed.    . fluticasone (FLONASE) 50 MCG/ACT nasal spray Place 1 spray into  both nostrils as needed.     . hydrochlorothiazide (HYDRODIURIL) 25 MG tablet Take 25 mg by mouth daily.    Marland Kitchen loratadine (CLARITIN) 10 MG tablet Take 10 mg by mouth daily.    . montelukast (SINGULAIR) 10 MG tablet Take 10 mg by mouth daily.    . Multiple Vitamins-Minerals (MULTIVITAMIN WITH MINERALS) tablet Take 1 tablet by mouth daily.    Marland Kitchen olmesartan (BENICAR) 20 MG tablet Take 20 mg by mouth daily.    Marland Kitchen omeprazole (PRILOSEC OTC) 20 MG tablet Take 20 mg by mouth daily.      . propranolol ER (INDERAL LA) 60 MG 24 hr capsule Take 1-2 capsules (60-120 mg total) by mouth daily. (Patient not taking: Reported on 07/31/2014) 60 capsule 3   No facility-administered medications prior to visit.    PAST MEDICAL HISTORY: Past Medical History  Diagnosis Date  . Anemia     history  . GERD (gastroesophageal reflux disease)   . Hypertension   . S/P Robotic hysterectomy, bilaterla salpingectomy, left oophorectomy (10/6) 05/17/2013    PAST SURGICAL HISTORY: Past Surgical History  Procedure Laterality Date  . Tubal ligation      essure tubal  . Cesarean section    . Wisdom tooth extraction    . Endometrial ablation  2013  . Robotic assisted total hysterectomy N/A 05/16/2013    Procedure: ROBOTIC ASSISTED TOTAL HYSTERECTOMY WITH LEFT OOPORECTOMY;  Surgeon: Alicia Kim, MD;  Location: [facility];  Service: Gynecology;  Laterality: N/A;  . Bilateral salpingectomy Bilateral 05/16/2013    Procedure:  BILATERAL SALPINGECTOMY;  Surgeon: Alicia Kim, MD;  Location: [facility];  Service: Gynecology;  Laterality: Bilateral;    FAMILY HISTORY: Family History  Problem Relation Age of Onset  . Hypertension Mother   . Stroke Father   . Breast cancer Mother     SOCIAL HISTORY:  History   Social History  . Marital Status: Divorced    Spouse Name: N/A    Number of Children: 1  . Years of Education: Bachelors   Occupational History  . [employer]   Social History Main Topics  . Smoking  status: Never Smoker   . Smokeless tobacco: Never Used  . Alcohol Use: 0.0 oz/week    0 Not specified per week     Comment: occasional wine  . Drug Use: No  . Sexual Activity: Yes    Birth Control/ Protection: Surgical   Other Topics Concern  . Not on file   Social History Narrative   Patient lives alone and works for [employer]   Patient Drinks 1 cup of caffeine daily.   Patient has a Bachelor's degree   Patient is right handed.        PHYSICAL EXAM  Filed Vitals:   07/31/14 1532  BP: 140/98  Pulse: 86  Height: 5' 5"  (1.651 m)  Weight: 186 lb (84.369 kg)    Body mass index is 30.95 kg/(m^2).  No exam data present  No flowsheet data found.  GENERAL EXAM: Patient is in no distress; well developed, nourished and groomed; neck is supple; SKIN RASH  CARDIOVASCULAR: Regular rate and rhythm, no murmurs, no carotid bruits; MILD TENDERNESS IN LEFT TEMPORAL ARTERY REGION; BILATERAL TEMP ARTERIES PALPABLE  NEUROLOGIC: MENTAL STATUS: awake, alert, language fluent, comprehension intact, naming intact, fund of knowledge appropriate CRANIAL NERVE: no papilledema on fundoscopic exam, pupils equal and reactive to light, visual fields full to confrontation, extraocular muscles intact, no nystagmus, facial sensation and strength symmetric, hearing intact, palate elevates symmetrically, uvula midline, shoulder shrug symmetric, tongue midline. MOTOR: normal bulk and tone, full strength in the BUE, BLE SENSORY: normal and symmetric to light touch COORDINATION: finger-nose-finger, fine finger movements normal REFLEXES: deep tendon reflexes present and symmetric GAIT/STATION: narrow based gait; able to walk tandem; romberg is negative    DIAGNOSTIC DATA (LABS, IMAGING, TESTING) - I reviewed patient records, labs, notes, testing and imaging myself where available.  Lab Results  Component Value Date   WBC 8.1 08/29/2013   HGB 11.7* 08/29/2013   HCT 35.9* 08/29/2013   MCV  82.0 08/29/2013   PLT 203 08/29/2013      Component Value Date/Time   NA 136* 08/29/2013 2330   K 3.4* 08/29/2013 2330   CL 98 08/29/2013 2330   CO2 25 08/29/2013 2330   GLUCOSE 98 08/29/2013 2330   BUN 15 08/29/2013 2330   CREATININE 0.77 08/29/2013 2330   CALCIUM 9.6 08/29/2013 2330   PROT 7.1 05/06/2013 1000   ALBUMIN 3.5 05/06/2013 1000   AST 16 05/06/2013 1000   ALT 12 05/06/2013 1000   ALKPHOS 56 05/06/2013 1000   BILITOT 0.3 05/06/2013 1000   GFRNONAA >90 08/29/2013 2330   GFRAA >90 08/29/2013 2330   No results found for: CHOL, HDL, LDLCALC, LDLDIRECT, TRIG, CHOLHDL No results found for: HGBA1C No results found for: VITAMINB12 No results found for: TSH  Lab Results  Component Value Date   ESRSEDRATE 8 07/05/2014   Lab Results  Component Value Date   CRP 2.5 07/05/2014  I reviewed images myself and agree with interpretation. -VRP  07/20/14 MRI brain - normal   07/20/14 MRA head - normal       ASSESSMENT AND PLAN  43 y.o. year old female here with lupus, with new onset headache since Oct 2015, with tension, migraine, autonomic and trigeminal features. MRI, MRA, labs normal.   Ddx: migraine variant vs other trigeminal autonomic cephalgia  PLAN: - topiramate 23m qhs --> 528mBID - sumatriptan prn - cambia prn  Return in about 2 months (around 10/01/2014).    VIPenni BombardMD [id number]/17/[id number] Certified in Neurology, Neurophysiology and Neuroimaging  [facility] [address]SuCorningrLacey[place] [id number]

## 2014-09-06 ENCOUNTER — Other Ambulatory Visit: Payer: Self-pay

## 2014-09-06 DIAGNOSIS — Z1231 Encounter for screening mammogram for malignant neoplasm of breast: Secondary | ICD-10-CM

## 2014-09-14 ENCOUNTER — Ambulatory Visit
Admission: RE | Admit: 2014-09-14 | Discharge: 2014-09-14 | Disposition: A | Discharge status: Home / Self Care | Payer: 59 | Source: Ambulatory Visit

## 2014-09-14 DIAGNOSIS — Z1231 Encounter for screening mammogram for malignant neoplasm of breast: Secondary | ICD-10-CM

## 2014-12-31 ENCOUNTER — Ambulatory Visit (INDEPENDENT_AMBULATORY_CARE_PROVIDER_SITE_OTHER): Payer: 59 | Admitting: Physician Assistant

## 2014-12-31 VITALS — BP 122/82 | HR 62 | Temp 98.0°F | Resp 20 | Ht 64.5 in | Wt 170.2 lb

## 2014-12-31 DIAGNOSIS — Z91048 Other nonmedicinal substance allergy status: Secondary | ICD-10-CM | POA: Diagnosis not present

## 2014-12-31 DIAGNOSIS — H1013 Acute atopic conjunctivitis, bilateral: Secondary | ICD-10-CM

## 2014-12-31 DIAGNOSIS — H04123 Dry eye syndrome of bilateral lacrimal glands: Secondary | ICD-10-CM | POA: Diagnosis not present

## 2014-12-31 DIAGNOSIS — Z9109 Other allergy status, other than to drugs and biological substances: Secondary | ICD-10-CM

## 2014-12-31 DIAGNOSIS — G43909 Migraine, unspecified, not intractable, without status migrainosus: Secondary | ICD-10-CM | POA: Insufficient documentation

## 2014-12-31 MED ORDER — MONTELUKAST SODIUM 10 MG PO TABS: 10.0000 mg | ORAL_TABLET | Freq: Every day | ORAL | Status: DC

## 2014-12-31 MED ORDER — MOMETASONE FUROATE 50 MCG/ACT NA SUSP: 2.0000 | Freq: Every day | NASAL | Status: DC

## 2014-12-31 NOTE — Progress Notes (Signed)
   Subjective:    Patient ID: Alicia Patel, female    DOB: 10-Aug-1971, 44 y.o.   MRN: [id number]  HPI Pt presents to clinic with bilateral eye irritation. Started 3 days ago and she thought that it was related to being up for >36h.  She has been using visine and allergy drops but nothing is really helping that much.  Eyes have been matted in the am and blood shot. She has not had exposure to pink eye that she knows of.  She states that she feels dirt in her eyes. No drainage during the day.  Patient Active Problem List   Diagnosis Date Noted  . Migraines 12/31/2014  . Accelerated hypertension 09/05/2013  . S/P Robotic hysterectomy, bilateral salpingectomy, left oophorectomy (10/6) 05/17/2013    The patient has a current medication list which includes the following prescription(s): epipen 2-pak, fluticasone, hydrochlorothiazide, loratadine, montelukast, multivitamin with minerals, topiramate, and sumatriptan.  Allergies  Allergen Reactions  . Celebrex [Celecoxib] Itching  . Clonidine Derivatives Swelling  . Sulfa Drugs Cross Reactors Itching and Nausea And Vomiting     Review of Systems  Constitutional: Negative for fever and chills.  Eyes: Positive for redness, itching and visual disturbance (blurry vision). Negative for photophobia and discharge.       Wear glasses but no contacts.  No eye make-up change.  Allergic/Immunologic: Positive for environmental allergies.      Objective:   Physical Exam  Constitutional: She is oriented to person, place, and time. She appears well-developed and well-nourished.  BP 122/82 mmHg  Pulse 62  Temp(Src) 98 F (36.7 C) (Oral)  Resp 20  Ht 5' 4.5" (1.638 m)  Wt 170 lb 4 oz (77.225 kg)  BMI 28.78 kg/m2  SpO2 92%  LMP 04/30/2013   HENT:  Head: Normocephalic and atraumatic.  Right Ear: Hearing, tympanic membrane, external ear and ear canal normal.  Left Ear: Hearing, tympanic membrane, external ear and ear canal normal.  Nose: Mucosal  edema (pale) present.  Mouth/Throat: Uvula is midline, oropharynx is clear and moist and mucous membranes are normal.  Eyes: EOM and lids are normal. Pupils are equal, round, and reactive to light. Right eye exhibits no discharge. Left eye exhibits no discharge. Right conjunctiva is injected (mild). Left eye conjunctiva injected: mild.  Cobblestoning bilateral lower lids  Neck: Normal range of motion.  Pulmonary/Chest: Effort normal.  Neurological: She is alert and oriented to person, place, and time.  Skin: Skin is warm and dry.  Psychiatric: She has a normal mood and affect. Her behavior is normal. Judgment and thought content normal.    Visual Acuity Screening   Right eye Left eye Both eyes  Without correction:     With correction: 20/20 20/15 20/13        Assessment & Plan:  Dry eyes, bilateral  Allergic conjunctivitis, bilateral  Environmental allergies - Plan: montelukast (SINGULAIR) 10 MG tablet, mometasone (NASONEX) 50 MCG/ACT nasal spray  Patient Instructions  Refresh artifical tears - the gel can be helpful during the day but I would use an ointment at night  Try to blink more often  Restart your nasonex to help with your allergy symptoms.  When you can tolerate the Zaditor stinging I would try to use that once daily.   If this continues pt will seek care with her eye MD.  Windell Hummingbird PA-C  [facility] 12/31/2014 1:06 PM

## 2014-12-31 NOTE — Patient Instructions (Addendum)
Refresh artifical tears - the gel can be helpful during the day but I would use an ointment at night  Try to blink more often  Restart your nasonex to help with your allergy symptoms.  When you can tolerate the Zaditor stinging I would try to use that once daily.

## 2015-01-01 ENCOUNTER — Telehealth: Payer: Self-pay

## 2015-01-01 NOTE — Telephone Encounter (Signed)
PA needed for Nasonex. Pt has tried/failed fluticasone, also uses claritin and singulair. Completed on covermymeds. Pending.

## 2015-01-02 NOTE — Telephone Encounter (Signed)
PA was denied bc it is a plan exclusion, and no other meds were listed as preferred alternatives. Called [facility] and was told that the only two similar NS on formulary are fluticasone and founisolide.  Judson Roch, do you want to Rx one of these?

## 2015-01-03 MED ORDER — FLUNISOLIDE 29 MCG/ACT NA SOLN: 2.0000 | Freq: Two times a day (BID) | NASAL | Status: DC

## 2015-01-03 NOTE — Telephone Encounter (Signed)
We will try Nasarel

## 2015-01-27 ENCOUNTER — Other Ambulatory Visit: Payer: Self-pay | Admitting: Physician Assistant

## 2015-05-20 ENCOUNTER — Emergency Department (HOSPITAL_COMMUNITY)
Admission: EM | Admit: 2015-05-20 | Discharge: 2015-05-20 | Disposition: A | Discharge status: Home / Self Care | Payer: 59 | Source: Home / Self Care | Attending: Emergency Medicine | Admitting: Emergency Medicine

## 2015-05-20 ENCOUNTER — Encounter (HOSPITAL_COMMUNITY): Payer: Self-pay | Admitting: *Deleted

## 2015-05-20 DIAGNOSIS — J0111 Acute recurrent frontal sinusitis: Secondary | ICD-10-CM | POA: Diagnosis not present

## 2015-05-20 HISTORY — DX: Migraine, unspecified, not intractable, without status migrainosus: G43.909

## 2015-05-20 LAB — POCT RAPID STREP A: STREPTOCOCCUS, GROUP A SCREEN (DIRECT): NEGATIVE

## 2015-05-20 MED ORDER — PREDNISONE 5 MG (21) PO TBPK: 5.0000 mg | ORAL_TABLET | Freq: Every day | ORAL | Status: DC

## 2015-05-20 MED ORDER — MONTELUKAST SODIUM 10 MG PO TABS: 10.0000 mg | ORAL_TABLET | Freq: Every day | ORAL | Status: AC

## 2015-05-20 MED ORDER — AMOXICILLIN-POT CLAVULANATE 875-125 MG PO TABS: 1.0000 | ORAL_TABLET | Freq: Two times a day (BID) | ORAL | Status: DC

## 2015-05-20 NOTE — ED Provider Notes (Signed)
CSN: [id number]     Arrival date & time 05/20/15  1625 History   First MD Initiated Contact with Patient 05/20/15 1656     Chief Complaint  Patient presents with  . Sore Throat  . Headache   (Consider location/radiation/quality/duration/timing/severity/associated sxs/prior Treatment) HPI Comments: Mrs. Bollen is a 44 yo female who presents with C/O sore throat, sinus pressure and "splitting HA" onset x  2 wks - started while in [place]. Went to an urgent care in [place] - was given Z-pak & had started to feel better, then flew back to [facility]. On flight, started feeling worse again. Completed Rx for Tussionex. Has been taking Sudafed, 625m IBU, but continues not feeling well. Notes "severe" sinus pressure.  No fevers or chills are noted. No cough.        Patient is a 44y.o. female presenting with pharyngitis and headaches. The history is provided by the patient.  Sore Throat Associated symptoms include headaches.  Headache   Past Medical History  Diagnosis Date  . Anemia     history  . GERD (gastroesophageal reflux disease)   . Hypertension   . S/P Robotic hysterectomy, bilaterla salpingectomy, left oophorectomy (10/6) 05/17/2013  . Migraines    Past Surgical History  Procedure Laterality Date  . Tubal ligation      essure tubal  . Cesarean section    . Wisdom tooth extraction    . Endometrial ablation  2013  . Robotic assisted total hysterectomy N/A 05/16/2013    Procedure: ROBOTIC ASSISTED TOTAL HYSTERECTOMY WITH LEFT OOPORECTOMY;  Surgeon: RLovenia Kim MD;  [facility];  Service: Gynecology;  Laterality: N/A;  . Bilateral salpingectomy Bilateral 05/16/2013    Procedure: BILATERAL SALPINGECTOMY;  Surgeon: RLovenia Kim MD;  [facility];  Service: Gynecology;  Laterality: Bilateral;  . Abdominal hysterectomy     Family History  Problem Relation Age of Onset  . Hypertension Mother   . Stroke Father   . Breast cancer Mother    Social History  Substance Use  Topics  . Smoking status: Never Smoker   . Smokeless tobacco: Never Used  . Alcohol Use: None   OB History    No data available     Review of Systems  Neurological: Positive for headaches.  All other systems reviewed and are negative.   Allergies  Codeine; Celebrex; Clonidine derivatives; and Sulfa drugs cross reactors  Home Medications   Prior to Admission medications   Medication Sig Start Date End Date Taking? Authorizing Provider  fluticasone (FLONASE) 50 MCG/ACT nasal spray Place 1 spray into both nostrils as needed.    Yes Historical Provider, MD  hydrochlorothiazide (HYDRODIURIL) 25 MG tablet Take 25 mg by mouth daily.   Yes Historical Provider, MD  topiramate (TOPAMAX) 25 MG tablet Take 1 tablet (25 mg total) by mouth 2 (two) times daily. 07/31/14  Yes VPenni Bombard MD  amoxicillin-clavulanate (AUGMENTIN) 875-125 MG tablet Take 1 tablet by mouth 2 (two) times daily. 05/20/15   MBjorn Pippin PA-C  EPIPEN 2-PAK 0.3 MG/0.3ML SOAJ injection as directed. 08/27/13   Historical Provider, MD  flunisolide (NASAREL) 29 MCG/ACT (0.025%) nasal spray Place 2 sprays into the nose 2 (two) times daily. Dose is for each nostril. 01/03/15   SMancel Bale PA-C  loratadine (CLARITIN) 10 MG tablet Take 10 mg by mouth daily.    Historical Provider, MD  mometasone (NASONEX) 50 MCG/ACT nasal spray Place 2 sprays into the nose daily. 12/31/14  Mancel Bale, PA-C  montelukast (SINGULAIR) 10 MG tablet Take 1 tablet (10 mg total) by mouth daily. 05/20/15   Bjorn Pippin, PA-C  Multiple Vitamins-Minerals (MULTIVITAMIN WITH MINERALS) tablet Take 1 tablet by mouth daily.    Historical Provider, MD  SUMAtriptan (IMITREX) 100 MG tablet Take 1 tablet (100 mg total) by mouth once as needed for migraine. May repeat x 1 after 2 hours; maximum 2 tabs per day and 8 tabs per month Patient not taking: Reported on 12/31/2014 07/31/14   Penni Bombard, MD   Meds Ordered and Administered this Visit   Medications - No data to display  BP 146/92 mmHg  Pulse 82  Temp(Src) 97.8 F (36.6 C) (Oral)  Resp 18  SpO2 99%  LMP 04/30/2013 No data found.   Physical Exam  Constitutional: She appears well-developed and well-nourished. No distress.  HENT:  Head: Normocephalic and atraumatic.  Right Ear: External ear normal.  Left Ear: External ear normal.  Oropharynx  with erythema/injection. No exudate. Percussion to maxillary and temporal sinus tender to palpation  Neck: Normal range of motion.  Cardiovascular: Normal rate and regular rhythm.   Pulmonary/Chest: Effort normal and breath sounds normal. No respiratory distress. She exhibits no tenderness.  Lymphadenopathy:    She has no cervical adenopathy.  Skin: Skin is warm and dry. No rash noted. She is not diaphoretic.  Psychiatric: Her behavior is normal.  Nursing note and vitals reviewed.   ED Course  Procedures (including critical care time)  Labs Review Labs Reviewed  POCT RAPID STREP A    Imaging Review No results found.   Visual Acuity Review  Right Eye Distance:   Left Eye Distance:   Bilateral Distance:    Right Eye Near:   Left Eye Near:    Bilateral Near:         MDM   1. Acute recurrent frontal sinusitis    Acute sinusitis which did not respond to initial use of Z-max given 10 days ago. Will change to Augmentin for appropriate sinus coverage. Refill singular and continue use with flonase. F/U if needed.     Bjorn Pippin, PA-C 05/20/15 1743

## 2015-05-20 NOTE — ED Notes (Signed)
C/O sore throat, sinus pressure and "splitting HA" x 2 wks - started while in [place].  Went to an urgent care in [place] - was given Z-pak & had started to feel better, then flew back to [facility].  On flight, started feeling "all out of whack" again.  Completed Rx for Tussionex.  Has been taking Sudafed, 666m IBU, but continues not feeling well.

## 2015-05-20 NOTE — Discharge Instructions (Signed)

## 2015-05-23 LAB — CULTURE, GROUP A STREP: Strep A Culture: NEGATIVE

## 2015-05-24 NOTE — ED Notes (Signed)
Final report of strep testing negative

## 2015-08-16 ENCOUNTER — Other Ambulatory Visit: Payer: Self-pay

## 2015-08-16 DIAGNOSIS — Z1231 Encounter for screening mammogram for malignant neoplasm of breast: Secondary | ICD-10-CM

## 2015-08-19 ENCOUNTER — Other Ambulatory Visit: Payer: Self-pay | Admitting: Diagnostic Neuroimaging

## 2015-08-20 ENCOUNTER — Other Ambulatory Visit: Payer: Self-pay

## 2015-09-18 ENCOUNTER — Ambulatory Visit
Admission: RE | Admit: 2015-09-18 | Discharge: 2015-09-18 | Disposition: A | Discharge status: Home / Self Care | Payer: 59 | Source: Ambulatory Visit

## 2015-09-18 DIAGNOSIS — Z1231 Encounter for screening mammogram for malignant neoplasm of breast: Secondary | ICD-10-CM

## 2016-06-11 ENCOUNTER — Other Ambulatory Visit: Payer: Self-pay | Admitting: Obstetrics and Gynecology

## 2016-06-11 DIAGNOSIS — Z1231 Encounter for screening mammogram for malignant neoplasm of breast: Secondary | ICD-10-CM

## 2016-06-13 ENCOUNTER — Other Ambulatory Visit: Payer: Self-pay | Admitting: Obstetrics and Gynecology

## 2016-06-13 DIAGNOSIS — N644 Mastodynia: Secondary | ICD-10-CM

## 2016-06-13 DIAGNOSIS — N6320 Unspecified lump in the left breast, unspecified quadrant: Secondary | ICD-10-CM

## 2016-06-16 ENCOUNTER — Ambulatory Visit
Admission: RE | Admit: 2016-06-16 | Discharge: 2016-06-16 | Disposition: A | Discharge status: Home / Self Care | Payer: 59 | Source: Ambulatory Visit | Attending: Obstetrics and Gynecology | Admitting: Obstetrics and Gynecology

## 2016-06-16 DIAGNOSIS — N6320 Unspecified lump in the left breast, unspecified quadrant: Secondary | ICD-10-CM

## 2016-06-16 DIAGNOSIS — N644 Mastodynia: Secondary | ICD-10-CM

## 2016-10-30 DIAGNOSIS — J309 Allergic rhinitis, unspecified: Secondary | ICD-10-CM | POA: Diagnosis not present

## 2016-10-30 DIAGNOSIS — I10 Essential (primary) hypertension: Secondary | ICD-10-CM | POA: Diagnosis not present

## 2016-11-26 DIAGNOSIS — T781XXA Other adverse food reactions, not elsewhere classified, initial encounter: Secondary | ICD-10-CM | POA: Diagnosis not present

## 2016-11-26 DIAGNOSIS — J3081 Allergic rhinitis due to animal (cat) (dog) hair and dander: Secondary | ICD-10-CM | POA: Diagnosis not present

## 2016-11-26 DIAGNOSIS — R079 Chest pain, unspecified: Secondary | ICD-10-CM | POA: Diagnosis not present

## 2016-11-26 DIAGNOSIS — J301 Allergic rhinitis due to pollen: Secondary | ICD-10-CM | POA: Diagnosis not present

## 2016-11-26 DIAGNOSIS — E559 Vitamin D deficiency, unspecified: Secondary | ICD-10-CM | POA: Diagnosis not present

## 2016-12-01 DIAGNOSIS — J3081 Allergic rhinitis due to animal (cat) (dog) hair and dander: Secondary | ICD-10-CM | POA: Diagnosis not present

## 2016-12-01 DIAGNOSIS — J301 Allergic rhinitis due to pollen: Secondary | ICD-10-CM | POA: Diagnosis not present

## 2016-12-02 DIAGNOSIS — J3089 Other allergic rhinitis: Secondary | ICD-10-CM | POA: Diagnosis not present

## 2016-12-11 DIAGNOSIS — K219 Gastro-esophageal reflux disease without esophagitis: Secondary | ICD-10-CM | POA: Diagnosis not present

## 2016-12-11 DIAGNOSIS — Z8249 Family history of ischemic heart disease and other diseases of the circulatory system: Secondary | ICD-10-CM | POA: Diagnosis not present

## 2016-12-11 DIAGNOSIS — I10 Essential (primary) hypertension: Secondary | ICD-10-CM | POA: Diagnosis not present

## 2017-04-22 DIAGNOSIS — N760 Acute vaginitis: Secondary | ICD-10-CM | POA: Diagnosis not present

## 2017-04-22 DIAGNOSIS — Z118 Encounter for screening for other infectious and parasitic diseases: Secondary | ICD-10-CM | POA: Diagnosis not present

## 2017-04-22 DIAGNOSIS — R350 Frequency of micturition: Secondary | ICD-10-CM | POA: Diagnosis not present

## 2017-10-12 DIAGNOSIS — Z0100 Encounter for examination of eyes and vision without abnormal findings: Secondary | ICD-10-CM | POA: Diagnosis not present

## 2018-02-24 DIAGNOSIS — R51 Headache: Secondary | ICD-10-CM | POA: Diagnosis not present

## 2018-02-24 DIAGNOSIS — I10 Essential (primary) hypertension: Secondary | ICD-10-CM | POA: Diagnosis not present

## 2018-02-24 DIAGNOSIS — E7800 Pure hypercholesterolemia, unspecified: Secondary | ICD-10-CM | POA: Diagnosis not present

## 2018-02-26 DIAGNOSIS — R5383 Other fatigue: Secondary | ICD-10-CM | POA: Diagnosis not present

## 2018-02-26 DIAGNOSIS — E559 Vitamin D deficiency, unspecified: Secondary | ICD-10-CM | POA: Diagnosis not present

## 2018-02-26 DIAGNOSIS — I10 Essential (primary) hypertension: Secondary | ICD-10-CM | POA: Diagnosis not present

## 2018-02-26 DIAGNOSIS — E7800 Pure hypercholesterolemia, unspecified: Secondary | ICD-10-CM | POA: Diagnosis not present

## 2018-04-15 DIAGNOSIS — J069 Acute upper respiratory infection, unspecified: Secondary | ICD-10-CM | POA: Diagnosis not present

## 2018-09-07 DIAGNOSIS — I10 Essential (primary) hypertension: Secondary | ICD-10-CM | POA: Diagnosis not present

## 2018-09-07 DIAGNOSIS — J069 Acute upper respiratory infection, unspecified: Secondary | ICD-10-CM | POA: Diagnosis not present

## 2018-09-24 DIAGNOSIS — Z0100 Encounter for examination of eyes and vision without abnormal findings: Secondary | ICD-10-CM | POA: Diagnosis not present

## 2018-11-25 DIAGNOSIS — J301 Allergic rhinitis due to pollen: Secondary | ICD-10-CM | POA: Diagnosis not present

## 2018-11-25 DIAGNOSIS — I10 Essential (primary) hypertension: Secondary | ICD-10-CM | POA: Diagnosis not present

## 2018-11-25 DIAGNOSIS — J309 Allergic rhinitis, unspecified: Secondary | ICD-10-CM | POA: Diagnosis not present

## 2019-05-30 ENCOUNTER — Other Ambulatory Visit: Payer: Self-pay | Admitting: Obstetrics and Gynecology

## 2019-05-30 DIAGNOSIS — Z1231 Encounter for screening mammogram for malignant neoplasm of breast: Secondary | ICD-10-CM

## 2019-06-29 ENCOUNTER — Other Ambulatory Visit: Payer: Self-pay | Admitting: Internal Medicine

## 2019-06-29 DIAGNOSIS — R200 Anesthesia of skin: Secondary | ICD-10-CM

## 2019-07-14 ENCOUNTER — Ambulatory Visit
Admission: RE | Admit: 2019-07-14 | Discharge: 2019-07-14 | Disposition: A | Discharge status: Home / Self Care | Payer: 59 | Source: Ambulatory Visit | Attending: Internal Medicine | Admitting: Internal Medicine

## 2019-07-14 ENCOUNTER — Other Ambulatory Visit: Payer: Self-pay

## 2019-07-14 DIAGNOSIS — R200 Anesthesia of skin: Secondary | ICD-10-CM

## 2019-07-14 DIAGNOSIS — R202 Paresthesia of skin: Secondary | ICD-10-CM

## 2019-07-20 ENCOUNTER — Ambulatory Visit: Payer: 59 | Admitting: Diagnostic Neuroimaging

## 2019-07-20 ENCOUNTER — Other Ambulatory Visit: Payer: Self-pay

## 2019-07-20 ENCOUNTER — Encounter: Payer: Self-pay | Admitting: Diagnostic Neuroimaging

## 2019-07-20 VITALS — BP 128/92 | HR 84 | Temp 97.7°F | Ht 65.0 in | Wt 191.0 lb

## 2019-07-20 DIAGNOSIS — G43009 Migraine without aura, not intractable, without status migrainosus: Secondary | ICD-10-CM

## 2019-07-20 MED ORDER — TOPIRAMATE 50 MG PO TABS: 50.0000 mg | ORAL_TABLET | Freq: Two times a day (BID) | ORAL | 12 refills | Status: AC

## 2019-07-20 MED ORDER — RIZATRIPTAN BENZOATE 10 MG PO TBDP: 10.0000 mg | ORAL_TABLET | ORAL | 11 refills | Status: DC | PRN

## 2019-07-20 NOTE — Patient Instructions (Signed)
MIGRAINE WITH AURA - MIGRAINE PREVENTION --> start topiramate 49m at bedtime; after 1 week increase to twice a day; drink plenty of water - MIGRAINE RESCUE --> rizatriptan 160mas needed for breakthrough headache; may repeat x 1 after 2 hours; max 2 tabs per day or 8 per month  TONGUE NUMBNESS (seconds; transient) - ? Migraine phenomenon; MRI unremarkable  MUSCLE TENSION - start yoga exercises; continue massage tx  INTERMITTENT LEFT HAND NUMBNESS (? Carpal tunnel syndrome) - mild; use wrist splint; consider EMG in future

## 2019-07-20 NOTE — Progress Notes (Signed)
[facility]  PATIENT: Alicia Patel DOB: 14-Feb-1971  REFERRING CLINICIAN: Kim HISTORY FROM: patient  REASON FOR VISIT: new consult   HISTORICAL  CHIEF COMPLAINT:  Chief Complaint  Patient presents with   Referral    Referral for left side numb from arm to leg room 7 for a month    HISTORY OF PRESENT ILLNESS:   UPDATE (07/20/19, VRP): 48 year old female with, since last visit, doing poorly in last 2 months. More headaches (15 days of tension HA) also 1 migraine per week (nausea, sens to light and sound). Also left arm pain / numbness (intermittent). Also 2 episodes of tongue numbness (few seconds). Not on TPX regularly. 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 Stress levels higher in last 6 months.    UPDATE 07/31/14: Since last visit, tried propranolol, with benefit in HA (8 per month to 2 per month). However noted more facial, arm and body swelling / fluid retention. Has stopped propranolol x 3 days and swelling is improving. Reports seeing headache/wellness center in 2008, for diff type of headache, tried TPX back then, without benefit. Daughter has migraine. Patient never tried triptans.  PRIOR HPI (07/05/14): 48 year old female with headaches. Frontal headache, right or left, for past 6 weeks. Tension pain, sinus pain. Face hurts with HA. Some shooting pain down left face. Feels some swelling under eye, with fever sensation. HA lasts 1 hour up to 2 hours. No nausea/vomiting. No scintillating scotoma. Mild blurred vision. HA are almost every day. No prior similar HA. No pain with chewing. No other triggering factors.  Has been tested for allergies and has multiple allergens. No history of anaphylaxis, but was given epi-pen as precaution.    REVIEW OF SYSTEMS: Full 14 system review of systems performed and negative except pain numbness weight gain.   ALLERGIES: Allergies  Allergen Reactions   Codeine    Celebrex [Celecoxib] Itching    Clonidine Derivatives Swelling   Sulfa Drugs Cross Reactors Itching and Nausea And Vomiting    HOME MEDICATIONS: Outpatient Medications Prior to Visit  Medication Sig Dispense Refill   Albuterol Sulfate, sensor, (PROAIR DIGIHALER) 108 (90 Base) MCG/ACT AEPB Inhale into the lungs.     B COMPLEX-C PO Take by mouth.     ELDERBERRY PO Take by mouth.     EPIPEN 2-PAK 0.3 MG/0.3ML SOAJ injection as directed.     fluticasone (FLONASE) 50 MCG/ACT nasal spray Place 1 spray into both nostrils as needed.      hydrochlorothiazide (HYDRODIURIL) 25 MG tablet Take 25 mg by mouth daily.     ibuprofen (ADVIL) 800 MG tablet Take 800 mg by mouth every 8 (eight) hours as needed.     levocetirizine (XYZAL) 5 MG tablet Take 5 mg by mouth at bedtime.     methocarbamol (ROBAXIN) 500 MG tablet Take 500 mg by mouth every 6 (six) hours as needed.     montelukast (SINGULAIR) 10 MG tablet Take 1 tablet (10 mg total) by mouth daily. 30 tablet 2   Multiple Vitamins-Minerals (MULTIVITAMIN WITH MINERALS) tablet Take 1 tablet by mouth daily.     topiramate (TOPAMAX) 25 MG tablet Take 1 tablet (25 mg total) by mouth 2 (two) times daily. (Patient taking differently: Take 25 mg by mouth as needed. ) 60 tablet 6   vitamin C (ASCORBIC ACID) 500 MG tablet Take 500 mg by mouth daily.     VITAMIN D PO Take 2,000 Units by mouth.  flunisolide (NASAREL) 29 MCG/ACT (0.025%) nasal spray Place 2 sprays into the nose 2 (two) times daily. Dose is for each nostril. (Patient not taking: Reported on 07/20/2019) 25 mL 2   loratadine (CLARITIN) 10 MG tablet Take 10 mg by mouth daily.     mometasone (NASONEX) 50 MCG/ACT nasal spray Place 2 sprays into the nose daily. (Patient not taking: Reported on 07/20/2019) 17 g 12   predniSONE (STERAPRED UNI-PAK 21 TAB) 5 MG (21) TBPK tablet Take 1 tablet (5 mg total) by mouth daily. (Patient not taking: Reported on 07/20/2019) 21 tablet 0   simvastatin (ZOCOR) 20 MG tablet Take by  mouth.     SUMAtriptan (IMITREX) 100 MG tablet Take 1 tablet (100 mg total) by mouth once as needed for migraine. May repeat x 1 after 2 hours; maximum 2 tabs per day and 8 tabs per month (Patient not taking: Reported on 07/20/2019) 8 tablet 6   No facility-administered medications prior to visit.     PAST MEDICAL HISTORY: Past Medical History:  Diagnosis Date   Anemia    history   GERD (gastroesophageal reflux disease)    Hypertension    Migraines    S/P Robotic hysterectomy, bilaterla salpingectomy, left oophorectomy (10/6) 05/17/2013    PAST SURGICAL HISTORY: Past Surgical History:  Procedure Laterality Date   ABDOMINAL HYSTERECTOMY     BILATERAL SALPINGECTOMY Bilateral 05/16/2013   Procedure: BILATERAL SALPINGECTOMY;  Surgeon: Lovenia Kim, MD;  Location: [facility];  Service: Gynecology;  Laterality: Bilateral;   CESAREAN SECTION     ENDOMETRIAL ABLATION  2013   ROBOTIC ASSISTED TOTAL HYSTERECTOMY N/A 05/16/2013   Procedure: ROBOTIC ASSISTED TOTAL HYSTERECTOMY WITH LEFT OOPORECTOMY;  Surgeon: Lovenia Kim, MD;  Location: [facility];  Service: Gynecology;  Laterality: N/A;   TUBAL LIGATION     essure tubal   WISDOM TOOTH EXTRACTION      FAMILY HISTORY: Family History  Problem Relation Age of Onset   Hypertension Mother    Breast cancer Mother    Stroke Father     SOCIAL HISTORY:  Social History   Socioeconomic History   Marital status: Divorced    Spouse name: Not on file   Number of children: 1   Years of education: Bachelors   Highest education level: Not on file  Occupational History   Occupation: DSO I    Employer: [employer] resource strain: Not on file   Food insecurity    Worry: Not on file    Inability: Not on file   Transportation needs    Medical: Not on file    Non-medical: Not on file  Tobacco Use   Smoking status: Never Smoker   Smokeless tobacco: Never Used  Substance and Sexual  Activity   Alcohol use: Yes    Alcohol/week: 1.0 standard drinks    Types: 1 Glasses of wine per week    Comment: occasionally   Drug use: No   Sexual activity: Not on file  Lifestyle   Physical activity    Days per week: Not on file    Minutes per session: Not on file   Stress: Not on file  Relationships   Social connections    Talks on phone: Not on file    Gets together: Not on file    Attends religious service: Not on file    Active member of club or organization: Not on file    Attends meetings of clubs or organizations:  Not on file    Relationship status: Not on file   Intimate partner violence    Fear of current or ex partner: Not on file    Emotionally abused: Not on file    Physically abused: Not on file    Forced sexual activity: Not on file  Other Topics Concern   Not on file  Social History Narrative   Patient lives alone and works for [employer]   Patient Drinks 1 cup of caffeine daily.   Patient has a Bachelor's degree   Patient is right handed.     PHYSICAL EXAM  GENERAL EXAM/CONSTITUTIONAL: Vitals:  Vitals:   07/20/19 1347  BP: (!) 128/92  Pulse: 84  Temp: 97.7 F (36.5 C)  Weight: 191 lb (86.6 kg)  Height: 5' 5"  (1.651 m)     Body mass index is 31.78 kg/m. Wt Readings from Last 3 Encounters:  07/20/19 191 lb (86.6 kg)  12/31/14 170 lb 4 oz (77.2 kg)  07/31/14 186 lb (84.4 kg)     Patient is in no distress; well developed, nourished and groomed; neck is supple  CARDIOVASCULAR:  Examination of carotid arteries is normal; no carotid bruits  Regular rate and rhythm, no murmurs  Examination of peripheral vascular system by observation and palpation is normal  EYES:  Ophthalmoscopic exam of optic discs and posterior segments is normal; no papilledema or hemorrhages  No exam data present  MUSCULOSKELETAL:  Gait, strength, tone, movements noted in Neurologic exam below  NEUROLOGIC: MENTAL STATUS:  No flowsheet data  found.  awake, alert, oriented to person, place and time  recent and remote memory intact  normal attention and concentration  language fluent, comprehension intact, naming intact  fund of knowledge appropriate  CRANIAL NERVE:   2nd - no papilledema on fundoscopic exam  2nd, 3rd, 4th, 6th - pupils equal and reactive to light, visual fields full to confrontation, extraocular muscles intact, no nystagmus  5th - facial sensation symmetric  7th - facial strength symmetric  8th - hearing intact  9th - palate elevates symmetrically, uvula midline  11th - shoulder shrug symmetric  12th - tongue protrusion midline  MOTOR:   normal bulk and tone, full strength in the BUE, BLE  SENSORY:   normal and symmetric to light touch  COORDINATION:   finger-nose-finger, fine finger movements normal  REFLEXES:   deep tendon reflexes present and symmetric  GAIT/STATION:   narrow based gait     DIAGNOSTIC DATA (LABS, IMAGING, TESTING) - I reviewed patient records, labs, notes, testing and imaging myself where available.  Lab Results  Component Value Date   WBC 8.1 08/29/2013   HGB 11.7 (L) 08/29/2013   HCT 35.9 (L) 08/29/2013   MCV 82.0 08/29/2013   PLT 203 08/29/2013      Component Value Date/Time   NA 136 (L) 08/29/2013 2330   K 3.4 (L) 08/29/2013 2330   CL 98 08/29/2013 2330   CO2 25 08/29/2013 2330   GLUCOSE 98 08/29/2013 2330   BUN 15 08/29/2013 2330   CREATININE 0.77 08/29/2013 2330   CALCIUM 9.6 08/29/2013 2330   PROT 7.1 05/06/2013 1000   ALBUMIN 3.5 05/06/2013 1000   AST 16 05/06/2013 1000   ALT 12 05/06/2013 1000   ALKPHOS 56 05/06/2013 1000   BILITOT 0.3 05/06/2013 1000   GFRNONAA >90 08/29/2013 2330   GFRAA >90 08/29/2013 2330   No results found for: CHOL, HDL, LDLCALC, LDLDIRECT, TRIG, CHOLHDL No results found for: HGBA1C No  results found for: VITAMINB12 No results found for: TSH  Lab Results  Component Value Date   ESRSEDRATE 8  07/05/2014   Lab Results  Component Value Date   CRP 2.5 07/05/2014    07/20/14 MRI brain - normal   07/20/14 MRA head - normal   07/14/19 MRI brain [I reviewed images myself and agree with interpretation. -VRP]  - No acute or reversible finding. No findings specifically suspicious for demyelinating disease. Few punctate foci of T2 and FLAIR signal in the frontal white matter, often seen in normal individuals.       ASSESSMENT AND PLAN  48 y.o. year old female here with lupus, with new onset headache since Oct 2015, with tension, migraine, autonomic and trigeminal features.    Dx:  1. Migraine without aura and without status migrainosus, not intractable       PLAN:  MIGRAINE WITH AURA - MIGRAINE PREVENTION --> start topiramate 69m at bedtime; after 1 week increase to twice a day; drink plenty of water - MIGRAINE RESCUE --> rizatriptan 111mas needed for breakthrough headache; may repeat x 1 after 2 hours; max 2 tabs per day or 8 per month  TONGUE NUMBNESS (seconds; transient) - ? Migraine phenomenon; MRI unremarkable  MUSCLE TENSION - start yoga exercises; continue massage tx  INTERMITTENT LEFT HAND NUMBNESS (? Carpal tunnel syndrome) - mild; use wrist splint; consider EMG in future  Meds ordered this encounter  Medications   topiramate (TOPAMAX) 50 MG tablet    Sig: Take 1 tablet (50 mg total) by mouth 2 (two) times daily.    Dispense:  60 tablet    Refill:  12   rizatriptan (MAXALT-MLT) 10 MG disintegrating tablet    Sig: Take 1 tablet (10 mg total) by mouth as needed for migraine. May repeat in 2 hours if needed    Dispense:  9 tablet    Refill:  11   Return in about 6 months (around 01/18/2020) for with NP (Amy Lomax).    VIPenni BombardMD [id number] Certified in Neurology, Neurophysiology and Neuroimaging  [facility] [address] [phone]

## 2019-08-11 ENCOUNTER — Encounter (HOSPITAL_COMMUNITY): Payer: Self-pay

## 2019-08-11 ENCOUNTER — Other Ambulatory Visit: Payer: Self-pay

## 2019-08-11 ENCOUNTER — Emergency Department (HOSPITAL_COMMUNITY)
Admission: EM | Admit: 2019-08-11 | Discharge: 2019-08-11 | Disposition: A | Discharge status: Home / Self Care | Payer: 59 | Attending: Emergency Medicine | Admitting: Emergency Medicine

## 2019-08-11 DIAGNOSIS — Z5321 Procedure and treatment not carried out due to patient leaving prior to being seen by health care provider: Secondary | ICD-10-CM | POA: Diagnosis not present

## 2019-08-11 DIAGNOSIS — M25512 Pain in left shoulder: Secondary | ICD-10-CM | POA: Diagnosis not present

## 2019-08-11 DIAGNOSIS — R110 Nausea: Secondary | ICD-10-CM | POA: Diagnosis not present

## 2019-08-11 NOTE — ED Notes (Signed)
Pt returned her stickers to registration.

## 2019-08-11 NOTE — ED Triage Notes (Signed)
Pt reports L shoulder pain that woke her up out of her sleep in a sweat. She reports a hx of bilateral shoulder pain that occurs with her migraines, but tonight was worse. Endorses some nausea as well.

## 2020-01-18 ENCOUNTER — Ambulatory Visit: Payer: 59 | Admitting: Family Medicine

## 2021-01-14 ENCOUNTER — Encounter: Payer: Self-pay | Admitting: Diagnostic Neuroimaging

## 2021-01-14 ENCOUNTER — Ambulatory Visit (INDEPENDENT_AMBULATORY_CARE_PROVIDER_SITE_OTHER): Payer: 59 | Admitting: Diagnostic Neuroimaging

## 2021-01-14 VITALS — BP 124/80 | HR 82 | Ht 65.0 in | Wt 208.0 lb

## 2021-01-14 DIAGNOSIS — G8929 Other chronic pain: Secondary | ICD-10-CM | POA: Diagnosis not present

## 2021-01-14 DIAGNOSIS — G43109 Migraine with aura, not intractable, without status migrainosus: Secondary | ICD-10-CM

## 2021-01-14 DIAGNOSIS — M25511 Pain in right shoulder: Secondary | ICD-10-CM | POA: Diagnosis not present

## 2021-01-14 DIAGNOSIS — M25512 Pain in left shoulder: Secondary | ICD-10-CM

## 2021-01-14 DIAGNOSIS — M542 Cervicalgia: Secondary | ICD-10-CM | POA: Diagnosis not present

## 2021-01-14 MED ORDER — RIZATRIPTAN BENZOATE 10 MG PO TBDP: 10.0000 mg | ORAL_TABLET | ORAL | 11 refills | Status: AC | PRN

## 2021-01-14 MED ORDER — AMITRIPTYLINE HCL 25 MG PO TABS: 25.0000 mg | ORAL_TABLET | Freq: Every day | ORAL | 6 refills | Status: AC

## 2021-01-14 NOTE — Progress Notes (Signed)
[facility]  PATIENT: Alicia Patel DOB: 12-01-1970  REFERRING CLINICIAN: Jani Gravel, MD  HISTORY FROM: patient  REASON FOR VISIT: follow up   HISTORICAL  CHIEF COMPLAINT:  Chief Complaint  Patient presents with  . Migraine    Rm 7,  "daily migraine with neck/shoulder pain, left > right, goes down my arm and leg; have done PT,just prescribed flexeril; topamax makes me loopy, I take Motrin 800 mg as needed"    HISTORY OF PRESENT ILLNESS:   UPDATE (01/14/21, VRP): Since last visit, doing well until 2-3 months ago. Now with daily neck and shoulder pressure and swelling sensation. Migraines have continued. TPX helped for a while, then caused side effects and patient stopped.    UPDATE (07/20/19, VRP): 50 year old female with, since last visit, doing poorly in last 2 months. More headaches (15 days of tension HA) also 1 migraine per week (nausea, sens to light and sound). Also left arm pain / numbness (intermittent). Also 2 episodes of tongue numbness (few seconds). Not on TPX regularly. 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 Stress levels higher in last 6 months.    UPDATE 07/31/14: Since last visit, tried propranolol, with benefit in HA (8 per month to 2 per month). However noted more facial, arm and body swelling / fluid retention. Has stopped propranolol x 3 days and swelling is improving. Reports seeing headache/wellness center in 2008, for diff type of headache, tried TPX back then, without benefit. Daughter has migraine. Patient never tried triptans.  PRIOR HPI (07/05/14): 50 year old female with headaches. Frontal headache, right or left, for past 6 weeks. Tension pain, sinus pain. Face hurts with HA. Some shooting pain down left face. Feels some swelling under eye, with fever sensation. HA lasts 1 hour up to 2 hours. No nausea/vomiting. No scintillating scotoma. Mild blurred vision. HA are almost every day. No prior similar HA. No pain with chewing.  No other triggering factors.  Has been tested for allergies and has multiple allergens. No history of anaphylaxis, but was given epi-pen as precaution.    REVIEW OF SYSTEMS: Full 14 system review of systems performed and negative except pain numbness weight gain.   ALLERGIES: Allergies  Allergen Reactions  . Codeine   . Amlodipine Besylate     Other reaction(s): edema  . Celebrex [Celecoxib] Itching  . Clonidine Derivatives Swelling  . Other     All seafood, closed throat  . Sulfa Drugs Cross Reactors Itching and Nausea And Vomiting    HOME MEDICATIONS: Outpatient Medications Prior to Visit  Medication Sig Dispense Refill  . Albuterol Sulfate, sensor, (PROAIR DIGIHALER) 108 (90 Base) MCG/ACT AEPB Inhale into the lungs.    . B COMPLEX-C PO Take by mouth.    . cyclobenzaprine (FLEXERIL) 10 MG tablet Take 1 tablet by mouth 4 (four) times daily as needed.    Marland Kitchen EPIPEN 2-PAK 0.3 MG/0.3ML SOAJ injection as directed.    . fluticasone (FLONASE) 50 MCG/ACT nasal spray Place 1 spray into both nostrils as needed.     . hydrochlorothiazide (HYDRODIURIL) 25 MG tablet Take 25 mg by mouth daily.    Marland Kitchen ibuprofen (ADVIL) 800 MG tablet Take 800 mg by mouth every 8 (eight) hours as needed.    Marland Kitchen levocetirizine (XYZAL) 5 MG tablet Take 5 mg by mouth at bedtime.    . montelukast (SINGULAIR) 10 MG tablet Take 1 tablet (10 mg total) by mouth daily. 30 tablet 2  . Multiple  Vitamins-Minerals (MULTIVITAMIN WITH MINERALS) tablet Take 1 tablet by mouth daily.    . rizatriptan (MAXALT-MLT) 10 MG disintegrating tablet Take 1 tablet (10 mg total) by mouth as needed for migraine. May repeat in 2 hours if needed 9 tablet 11  . topiramate (TOPAMAX) 50 MG tablet Take 1 tablet (50 mg total) by mouth 2 (two) times daily. 60 tablet 12  . VITAMIN D PO Take 2,000 Units by mouth.     . methocarbamol (ROBAXIN) 500 MG tablet Take 500 mg by mouth every 6 (six) hours as needed. (Patient not taking: Reported on 01/14/2021)    .  ELDERBERRY PO Take by mouth.    . vitamin C (ASCORBIC ACID) 500 MG tablet Take 500 mg by mouth daily.     No facility-administered medications prior to visit.    PAST MEDICAL HISTORY: Past Medical History:  Diagnosis Date  . Anemia    history  . GERD (gastroesophageal reflux disease)   . Hypertension   . Lupus (HCC)    subcutaneous  . Migraines   . S/P Robotic hysterectomy, bilaterla salpingectomy, left oophorectomy (10/6) 05/17/2013    PAST SURGICAL HISTORY: Past Surgical History:  Procedure Laterality Date  . ABDOMINAL HYSTERECTOMY    . BILATERAL SALPINGECTOMY Bilateral 05/16/2013   Procedure: BILATERAL SALPINGECTOMY;  Surgeon: Lovenia Kim, MD;  Location: [facility];  Service: Gynecology;  Laterality: Bilateral;  . CESAREAN SECTION    . ENDOMETRIAL ABLATION  2013  . ROBOTIC ASSISTED TOTAL HYSTERECTOMY N/A 05/16/2013   Procedure: ROBOTIC ASSISTED TOTAL HYSTERECTOMY WITH LEFT OOPORECTOMY;  Surgeon: Lovenia Kim, MD;  Location: [facility];  Service: Gynecology;  Laterality: N/A;  . TUBAL LIGATION     essure tubal  . WISDOM TOOTH EXTRACTION      FAMILY HISTORY: Family History  Problem Relation Age of Onset  . Hypertension Mother   . Breast cancer Mother   . Stroke Father     SOCIAL HISTORY:  Social History   Socioeconomic History  . Marital status: Divorced    Spouse name: Not on file  . Number of children: 1  . Years of education: Bachelors  . Highest education level: Not on file  Occupational History  . Occupation: DSO I    Employer: [employer]  Tobacco Use  . Smoking status: Never Smoker  . Smokeless tobacco: Never Used  Substance and Sexual Activity  . Alcohol use: Yes    Alcohol/week: 1.0 standard drink    Types: 1 Glasses of wine per week    Comment: occasionally  . Drug use: No  . Sexual activity: Not on file  Other Topics Concern  . Not on file  Social History Narrative   Patient lives alone and works for [employer]   Patient Drinks 1  cup of caffeine daily.   Patient has a Bachelor's degree   Patient is right handed.   Social Determinants of Health   Financial Resource Strain: Not on file  Food Insecurity: Not on file  Transportation Needs: Not on file  Physical Activity: Not on file  Stress: Not on file  Social Connections: Not on file  Intimate Partner Violence: Not on file     PHYSICAL EXAM  GENERAL EXAM/CONSTITUTIONAL: Vitals:  Vitals:   01/14/21 1522  BP: 124/80  Pulse: 82  Weight: 208 lb (94.3 kg)  Height: 5' 5"  (1.651 m)   Body mass index is 34.61 kg/m. Wt Readings from Last 3 Encounters:  01/14/21 208 lb (94.3 kg)  08/11/19  185 lb (83.9 kg)  07/20/19 191 lb (86.6 kg)    Patient is in no distress; well developed, nourished and groomed; neck is supple  CARDIOVASCULAR:  Examination of carotid arteries is normal; no carotid bruits  Regular rate and rhythm, no murmurs  Examination of peripheral vascular system by observation and palpation is normal  EYES:  Ophthalmoscopic exam of optic discs and posterior segments is normal; no papilledema or hemorrhages No exam data present  MUSCULOSKELETAL:  Gait, strength, tone, movements noted in Neurologic exam below  NEUROLOGIC: MENTAL STATUS:  No flowsheet data found.  awake, alert, oriented to person, place and time  recent and remote memory intact  normal attention and concentration  language fluent, comprehension intact, naming intact  fund of knowledge appropriate  CRANIAL NERVE:   2nd - no papilledema on fundoscopic exam  2nd, 3rd, 4th, 6th - pupils equal and reactive to light, visual fields full to confrontation, extraocular muscles intact, no nystagmus  5th - facial sensation symmetric  7th - facial strength symmetric  8th - hearing intact  9th - palate elevates symmetrically, uvula midline  11th - shoulder shrug symmetric  12th - tongue protrusion midline  MOTOR:   normal bulk and tone, full strength in the  BUE, BLE  SENSORY:   normal and symmetric to light touch  COORDINATION:   finger-nose-finger, fine finger movements normal  REFLEXES:   deep tendon reflexes present and symmetric  GAIT/STATION:   narrow based gait     DIAGNOSTIC DATA (LABS, IMAGING, TESTING) - I reviewed patient records, labs, notes, testing and imaging myself where available.  Lab Results  Component Value Date   WBC 8.1 08/29/2013   HGB 11.7 (L) 08/29/2013   HCT 35.9 (L) 08/29/2013   MCV 82.0 08/29/2013   PLT 203 08/29/2013      Component Value Date/Time   NA 136 (L) 08/29/2013 2330   K 3.4 (L) 08/29/2013 2330   CL 98 08/29/2013 2330   CO2 25 08/29/2013 2330   GLUCOSE 98 08/29/2013 2330   BUN 15 08/29/2013 2330   CREATININE 0.77 08/29/2013 2330   CALCIUM 9.6 08/29/2013 2330   PROT 7.1 05/06/2013 1000   ALBUMIN 3.5 05/06/2013 1000   AST 16 05/06/2013 1000   ALT 12 05/06/2013 1000   ALKPHOS 56 05/06/2013 1000   BILITOT 0.3 05/06/2013 1000   GFRNONAA >90 08/29/2013 2330   GFRAA >90 08/29/2013 2330   No results found for: CHOL, HDL, LDLCALC, LDLDIRECT, TRIG, CHOLHDL No results found for: HGBA1C No results found for: VITAMINB12 No results found for: TSH  Lab Results  Component Value Date   ESRSEDRATE 8 07/05/2014   Lab Results  Component Value Date   CRP 2.5 07/05/2014    07/20/14 MRI brain - normal   07/20/14 MRA head - normal   07/14/19 MRI brain [I reviewed images myself and agree with interpretation. -VRP]  - No acute or reversible finding. No findings specifically suspicious for demyelinating disease. Few punctate foci of T2 and FLAIR signal in the frontal white matter, often seen in normal individuals.       ASSESSMENT AND PLAN  50 y.o. year old female here with lupus, with new onset headache since Oct 2015, with tension, migraine, autonomic and trigeminal features.    Dx:  1. Migraine with aura and without status migrainosus, not intractable   2. Neck pain   3.  Chronic pain of both shoulders       PLAN:  MIGRAINE WITH AURA  MIGRAINE TREATMENT PLAN:  MIGRAINE PREVENTION  LIFESTYLE CHANGES -Stop or avoid smoking -Decrease or avoid caffeine / alcohol -Eat and sleep on a regular schedule -Exercise several times per week - start amitriptyline 2m at bedtime  Consider  - erenumab (Aimovig) 761mmonthly (may increase to 14047monthly) - fremanezumab (Ajovy) 225m28mnthly (or 675mg53mry 3 months) - galazanezumab (Emgality) 240mg 55ming dose; then 120mg m13mly   MIGRAINE RESCUE  - ibuprofen, tylenol as needed - rizatriptan (Maxalt) 10mg as25mded for breakthrough headache; may repeat x 1 after 2 hours; max 2 tabs per day or 8 per month   NECK PAIN / SHOULDER PAIN - follow up with rheumatology and PT evaluation  LUPUS (skin and light sensitive) - follow up with rheumatology  TONGUE NUMBNESS (seconds; transient; resolved) - ? Migraine phenomenon; MRI unremarkable  INTERMITTENT LEFT HAND NUMBNESS (? Carpal tunnel syndrome; resolved) - mild; use wrist splint; consider EMG in future   Meds ordered this encounter  Medications  . amitriptyline (ELAVIL) 25 MG tablet    Sig: Take 1 tablet (25 mg total) by mouth at bedtime.    Dispense:  30 tablet    Refill:  6  . rizatriptan (MAXALT-MLT) 10 MG disintegrating tablet    Sig: Take 1 tablet (10 mg total) by mouth as needed for migraine. May repeat in 2 hours if needed    Dispense:  9 tablet    Refill:  11   Return in about 6 months (around 07/16/2021).    Alicia Patel RPenni Bombard/[id number] 7:67ified in Neurology, Neurophysiology and Neuroimaging  [facility] [address] ([phone]

## 2021-01-14 NOTE — Patient Instructions (Signed)
MIGRAINE PREVENTION  LIFESTYLE CHANGES -Stop or avoid smoking -Decrease or avoid caffeine / alcohol -Eat and sleep on a regular schedule -Exercise several times per week - start amitriptyline 82m at bedtime  MIGRAINE RESCUE  - ibuprofen, tylenol as needed - continue rizatriptan (Maxalt) 112mas needed for breakthrough headache; may repeat x 1 after 2 hours; max 2 tabs per day or 8 per month   NECK PAIN / SHOULDER PAIN - follow up with rheumatology and PT evaluation  LUPUS (skin and light sensitive) - follow up with rheumatology

## 2021-02-15 IMAGING — MR MR HEAD W/O CM
11 series · 48 of 48 positions shown · non-contrast
Comparison: Head CT 12/29/2012

CLINICAL DATA: Left arm and leg numbness over the last 6 weeks.
Recent episode of numbness which resolved.

EXAM:
MRI HEAD WITHOUT CONTRAST
TECHNIQUE: Multiplanar, multiecho pulse sequences of the brain and surrounding
structures were obtained without intravenous contrast.

[Series 2: T1 · sagittal · 5.0mm · 0.47mm/px · 3 of 23 slices shown]
[im 1/23]
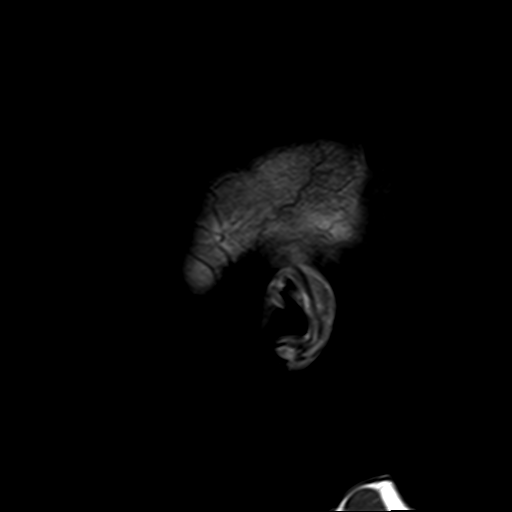
[im 12/23]
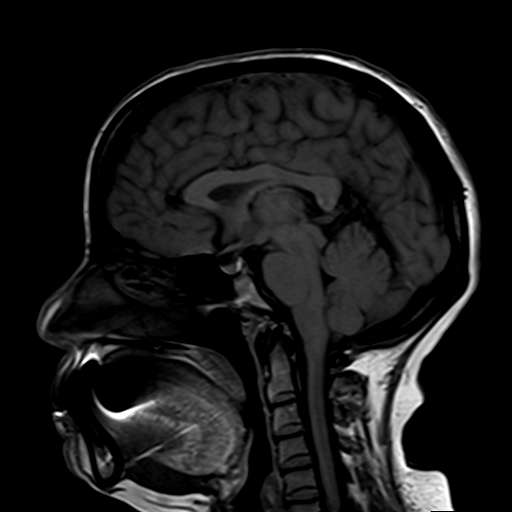
[im 23/23]
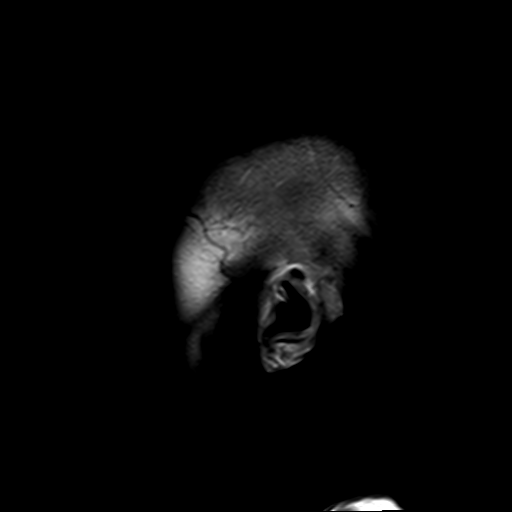

[Series 3: DWI · axial · 3.0mm · 1.88mm/px · z∈[-68,+79]mm · 8 of 96 slices shown (1 of 4)]
[im 1/96]
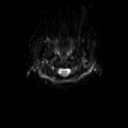
[im 14/96]
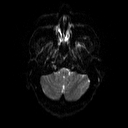
[im 28/96]
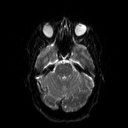
[im 41/96]
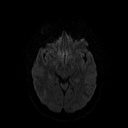
[im 55/96]
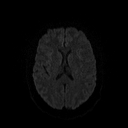
[im 68/96]
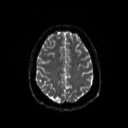
[im 82/96]
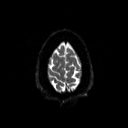
[im 96/96]
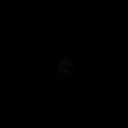

[Series 4: DWI · axial · 3.0mm · 1.88mm/px · z∈[-68,+79]mm · 4 of 50 slices shown (2 of 4)]
[im 1/50]
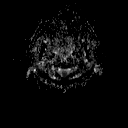
[im 17/50]
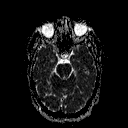
[im 33/50]
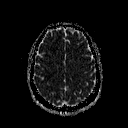
[im 50/50]
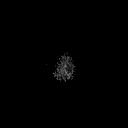

[Series 5: FLAIR · axial · 3.0mm · 0.47mm/px · z∈[-66,+77]mm · 3 of 32 slices shown (1 of 2)]
[im 1/32]
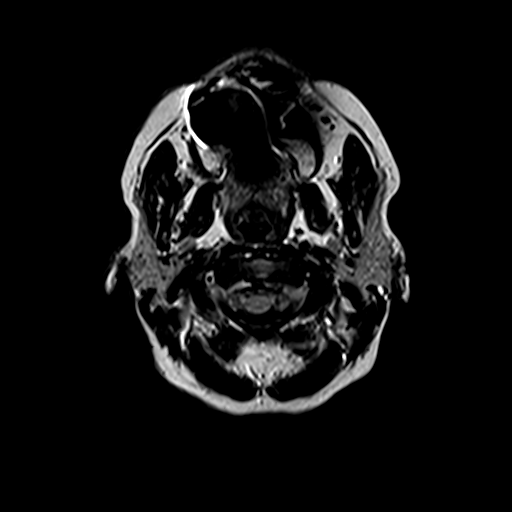
[im 16/32]
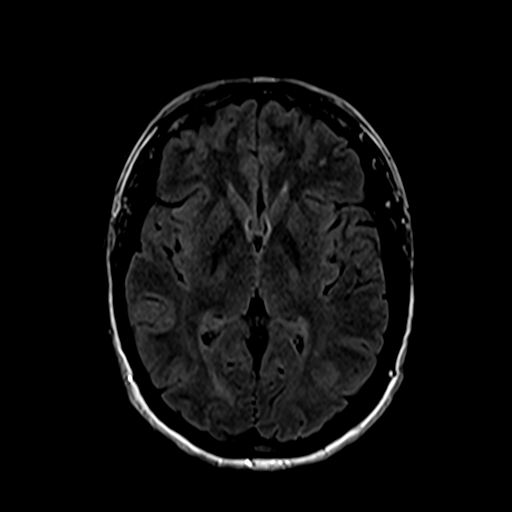
[im 32/32]
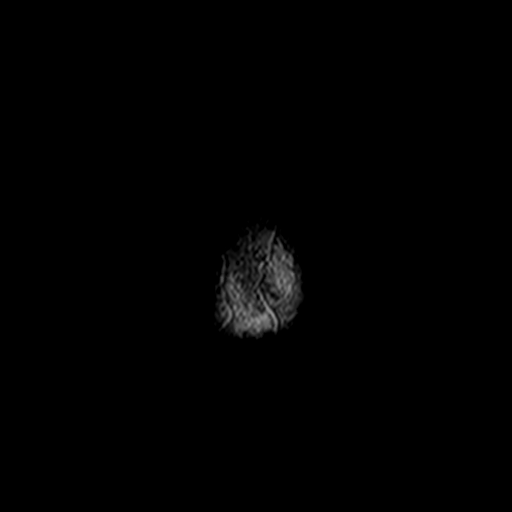

[Series 6: T2 · axial · 5.0mm · 0.62mm/px · z∈[-67,+74]mm · 2 of 22 slices shown (1 of 2)]
[im 1/22]
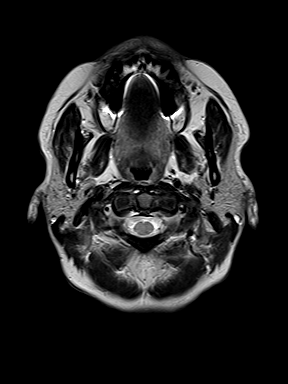
[im 22/22]
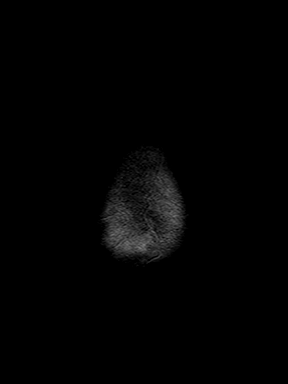

[Series 8: swi_images · axial · 5.0mm · 0.94mm/px · z∈[-73,+82]mm · 3 of 32 slices shown]
[im 1/32]
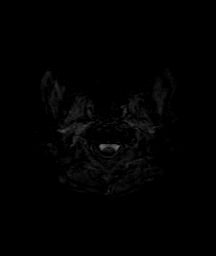
[im 16/32]
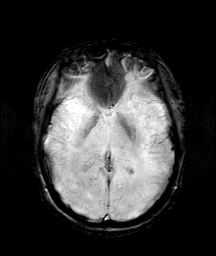
[im 32/32]
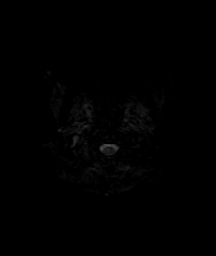

[Series 9: t1_mpr_tra · axial · 1.0mm · 0.75mm/px · z∈[-65,+77]mm · 12 of 144 slices shown]
[im 1/144]
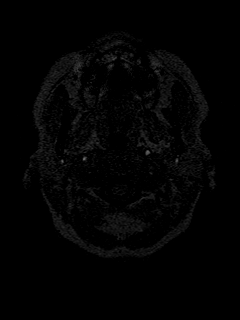
[im 14/144]
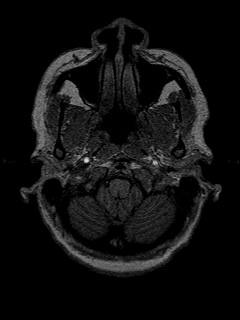
[im 27/144]
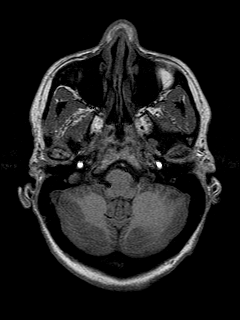
[im 40/144]
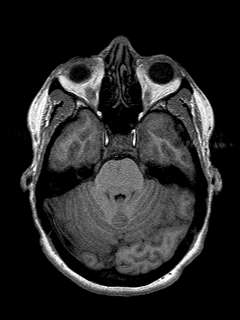
[im 53/144]
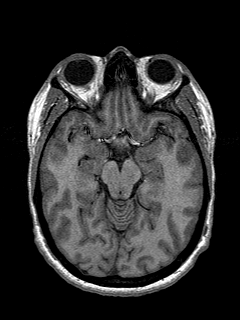
[im 66/144]
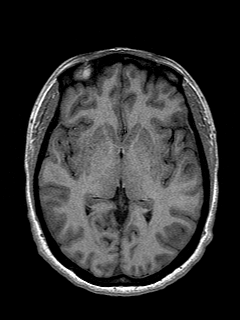
[im 79/144]
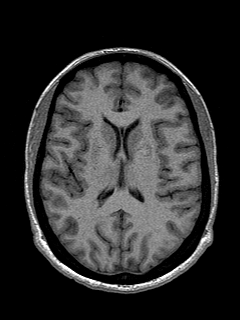
[im 92/144]
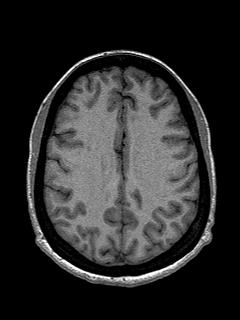
[im 105/144]
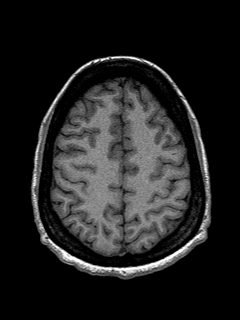
[im 118/144]
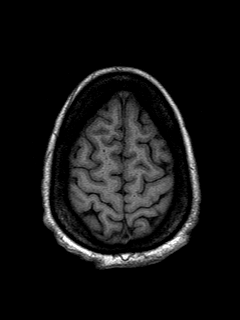
[im 131/144]
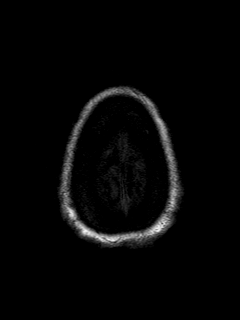
[im 144/144]
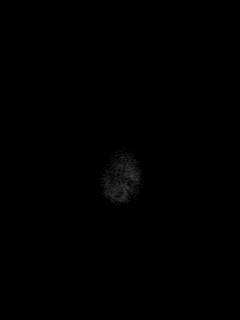

[Series 10: DWI · coronal · 5.0mm · 1.80mm/px · 6 of 73 slices shown (3 of 4)]
[im 1/73]
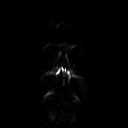
[im 15/73]
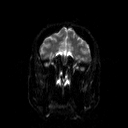
[im 29/73]
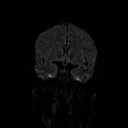
[im 44/73]
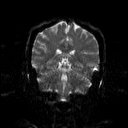
[im 58/73]
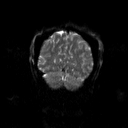
[im 73/73]
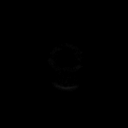

[Series 11: DWI · coronal · 5.0mm · 1.80mm/px · 3 of 37 slices shown (4 of 4)]
[im 1/37]
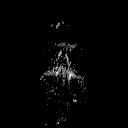
[im 19/37]
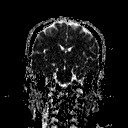
[im 37/37]
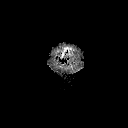

[Series 12: T2 · coronal · 5.0mm · 0.45mm/px · 2 of 29 slices shown (2 of 2)]
[im 1/29]
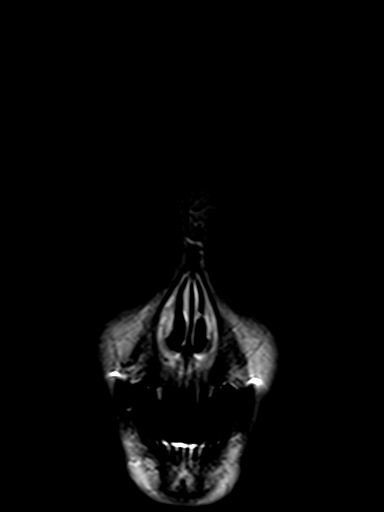
[im 29/29]
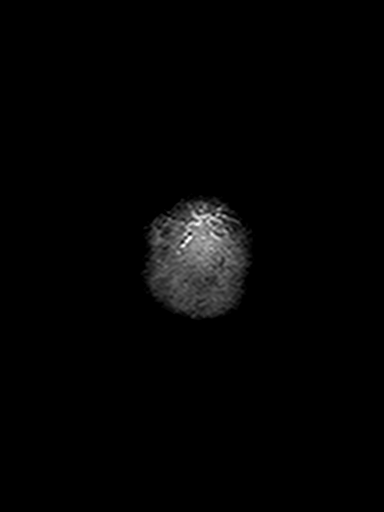

[Series 13: FLAIR · sagittal · 5.0mm · 0.45mm/px · 2 of 28 slices shown (2 of 2)]
[im 1/28]
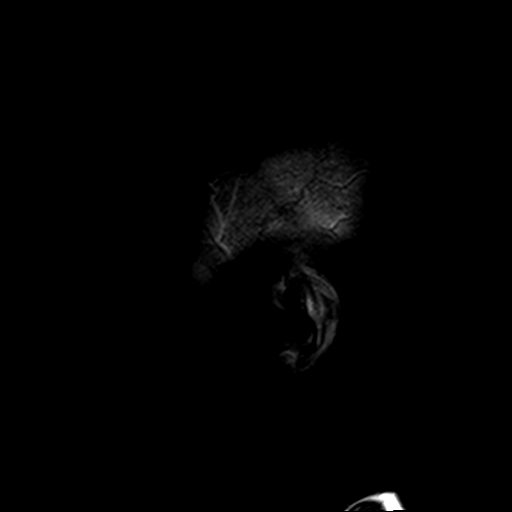
[im 28/28]
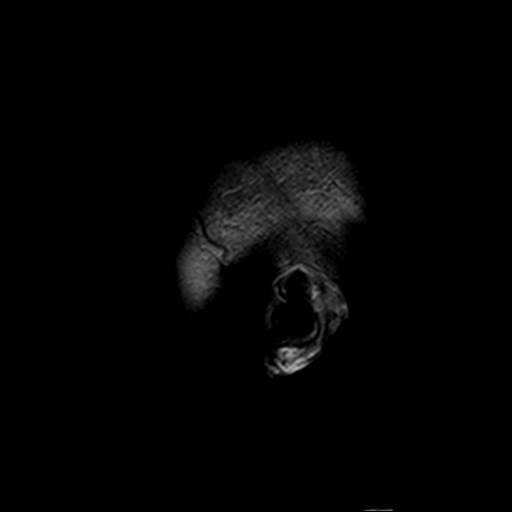

[48 of 48 positions shown; findings below may reference images not displayed]

FINDINGS: Brain: Diffusion imaging does not show any acute or subacute
infarction. The brainstem and cerebellum are normal. Cerebral
hemispheres are normal except for a few punctate foci of T2 FLAIR
signal in the frontal white matter often seen in normal individuals.
No cortical or large vessel territory infarction. No mass lesion,
hemorrhage, hydrocephalus or extra-axial collection. No findings
specifically suspicious for demyelinating disease.

Vascular: Major vessels at the base of the brain show flow.

Skull and upper cervical spine: Negative

Sinuses/Orbits: Clear/normal

Other: Normal
IMPRESSION: No acute or reversible finding. No findings specifically suspicious
for demyelinating disease. Few punctate foci of T2 and FLAIR signal
in the frontal white matter, often seen in normal individuals.

## 2021-07-15 ENCOUNTER — Ambulatory Visit: Payer: 59 | Admitting: Diagnostic Neuroimaging

## 2023-05-25 DIAGNOSIS — I10 Essential (primary) hypertension: Secondary | ICD-10-CM | POA: Diagnosis not present

## 2023-07-20 DIAGNOSIS — I10 Essential (primary) hypertension: Secondary | ICD-10-CM | POA: Diagnosis not present

## 2023-07-20 DIAGNOSIS — E785 Hyperlipidemia, unspecified: Secondary | ICD-10-CM | POA: Diagnosis not present

## 2023-07-20 DIAGNOSIS — R5383 Other fatigue: Secondary | ICD-10-CM | POA: Diagnosis not present

## 2023-07-27 DIAGNOSIS — Z0000 Encounter for general adult medical examination without abnormal findings: Secondary | ICD-10-CM | POA: Diagnosis not present

## 2023-09-30 DIAGNOSIS — Z1231 Encounter for screening mammogram for malignant neoplasm of breast: Secondary | ICD-10-CM | POA: Diagnosis not present

## 2023-09-30 DIAGNOSIS — Z1331 Encounter for screening for depression: Secondary | ICD-10-CM | POA: Diagnosis not present

## 2023-09-30 DIAGNOSIS — Z01419 Encounter for gynecological examination (general) (routine) without abnormal findings: Secondary | ICD-10-CM | POA: Diagnosis not present

## 2023-10-19 DIAGNOSIS — B349 Viral infection, unspecified: Secondary | ICD-10-CM | POA: Diagnosis not present

## 2023-10-19 DIAGNOSIS — R051 Acute cough: Secondary | ICD-10-CM | POA: Diagnosis not present

## 2023-11-24 DIAGNOSIS — Z79899 Other long term (current) drug therapy: Secondary | ICD-10-CM | POA: Diagnosis not present

## 2023-11-24 DIAGNOSIS — R21 Rash and other nonspecific skin eruption: Secondary | ICD-10-CM | POA: Diagnosis not present

## 2023-11-24 DIAGNOSIS — M1990 Unspecified osteoarthritis, unspecified site: Secondary | ICD-10-CM | POA: Diagnosis not present

## 2023-11-24 DIAGNOSIS — M329 Systemic lupus erythematosus, unspecified: Secondary | ICD-10-CM | POA: Diagnosis not present

## 2023-11-25 DIAGNOSIS — Z79899 Other long term (current) drug therapy: Secondary | ICD-10-CM | POA: Diagnosis not present

## 2023-12-31 DIAGNOSIS — E785 Hyperlipidemia, unspecified: Secondary | ICD-10-CM | POA: Diagnosis not present

## 2023-12-31 DIAGNOSIS — E559 Vitamin D deficiency, unspecified: Secondary | ICD-10-CM | POA: Diagnosis not present

## 2023-12-31 DIAGNOSIS — M329 Systemic lupus erythematosus, unspecified: Secondary | ICD-10-CM | POA: Diagnosis not present

## 2024-01-07 DIAGNOSIS — K219 Gastro-esophageal reflux disease without esophagitis: Secondary | ICD-10-CM | POA: Diagnosis not present

## 2024-01-07 DIAGNOSIS — I10 Essential (primary) hypertension: Secondary | ICD-10-CM | POA: Diagnosis not present

## 2024-01-07 DIAGNOSIS — E663 Overweight: Secondary | ICD-10-CM | POA: Diagnosis not present

## 2024-01-14 DIAGNOSIS — J0100 Acute maxillary sinusitis, unspecified: Secondary | ICD-10-CM | POA: Diagnosis not present
